# Patient Record
Sex: Female | Born: 1975 | Race: White | Hispanic: No | Marital: Married | Smoking: Never smoker
Health system: Southern US, Community
[De-identification: ages and names within clinical notes are randomized; demographics above are authoritative.]

## PROBLEM LIST (undated history)

## (undated) DIAGNOSIS — D649 Anemia, unspecified: Secondary | ICD-10-CM

## (undated) DIAGNOSIS — R519 Headache, unspecified: Secondary | ICD-10-CM

## (undated) DIAGNOSIS — J189 Pneumonia, unspecified organism: Secondary | ICD-10-CM

## (undated) DIAGNOSIS — I10 Essential (primary) hypertension: Secondary | ICD-10-CM

## (undated) DIAGNOSIS — N3281 Overactive bladder: Secondary | ICD-10-CM

## (undated) HISTORY — PX: CHOLECYSTECTOMY: SHX55

## (undated) HISTORY — PX: ADENOIDECTOMY: SUR15

---

## 2004-06-19 ENCOUNTER — Inpatient Hospital Stay: Payer: Self-pay | Admitting: Obstetrics & Gynecology

## 2009-01-09 ENCOUNTER — Inpatient Hospital Stay: Payer: Self-pay | Admitting: Unknown Physician Specialty

## 2011-09-08 ENCOUNTER — Ambulatory Visit: Payer: Self-pay

## 2011-09-10 ENCOUNTER — Ambulatory Visit: Payer: Self-pay

## 2015-11-29 DIAGNOSIS — D510 Vitamin B12 deficiency anemia due to intrinsic factor deficiency: Secondary | ICD-10-CM | POA: Insufficient documentation

## 2016-01-21 ENCOUNTER — Encounter: Payer: Self-pay | Admitting: *Deleted

## 2016-01-21 ENCOUNTER — Ambulatory Visit
Admission: EM | Admit: 2016-01-21 | Discharge: 2016-01-21 | Disposition: A | Discharge status: Home / Self Care | Payer: BC Managed Care – PPO | Attending: Internal Medicine | Admitting: Internal Medicine

## 2016-01-21 DIAGNOSIS — IMO0001 Reserved for inherently not codable concepts without codable children: Secondary | ICD-10-CM

## 2016-01-21 DIAGNOSIS — I10 Essential (primary) hypertension: Secondary | ICD-10-CM | POA: Diagnosis not present

## 2016-01-21 DIAGNOSIS — R030 Elevated blood-pressure reading, without diagnosis of hypertension: Secondary | ICD-10-CM

## 2016-01-21 NOTE — Discharge Instructions (Signed)
Your blood pressure today was elevated, without alarm signs.  This may reflect the effects of fatigue, stress, impending viral illness, medication/supplement side effect, dietary exposure (salt).  If it continues, would need to consider starting a blood pressure medicine and possibly further evaluation for specific causes of high blood pressure. Please make appointment with Dr Hyacinth MeekerMiller to recheck blood pressure and discuss next week. Go to ER for persistent/severe chest discomfort or headache or new shortness of breath.

## 2016-01-21 NOTE — ED Notes (Signed)
Pt states "haven't been feeling right today" so checked her BP at home and got 174/102 @ 1700, and again @ 1745 was 152/92.

## 2016-01-21 NOTE — ED Provider Notes (Signed)
CSN: [id number]     Arrival date & time 01/21/16  1849 History   None    Chief Complaint  Patient presents with  . Hypertension   HPI   39yo lady with elevated bp readings today at home (used mom's machine).  Some family members to ED recently with bp, heart problems.  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 Not on bp medication.  Has had elevated readings in past, not sure how high.  Little bit of chest pain (1 episode, 2-3 minutes, mild discomfort, at rest earlier today, no recurrence).  No current CP, no headache, not short of breath, no leg swelling. PCP is Dr Bethann PunchesMark Miller.  Recently ?WBC reading was elevated, on B12 supplement, will recheck WBC count on 6/13.  PMHx: recent elevation of ?WBC; elevated bp's in past Past Surgical History  Procedure Laterality Date  . Cholecystectomy     History reviewed. No pertinent family history. Social History  Substance Use Topics  . Smoking status: Never Smoker   . Smokeless tobacco: None  . Alcohol Use: Yes    Review of Systems  All other systems reviewed and are negative.   Allergies  Penicillins  Home Medications  B12 supplement (oral)   BP 161/90 mmHg  Pulse 86  Temp(Src) 98.1 F (36.7 C) (Oral)  Resp 16  Ht 5\' 1"  (1.549 m)  Wt 202 lb (91.627 kg)  BMI 38.19 kg/m2  SpO2 100%  LMP 12/22/2015 (Approximate) Physical Exam  Constitutional: She is oriented to person, place, and time. No distress.  Alert, nicely groomed Looks a little tired  HENT:  Head: Atraumatic.  Eyes:  Conjugate gaze, no eye redness/drainage  Neck: Neck supple.  Cardiovascular: Normal rate and regular rhythm.   Pulmonary/Chest: No respiratory distress. She has no wheezes. She has no rales.  Lungs clear, symmetric breath sounds  Abdominal: She exhibits no distension.  Musculoskeletal: Normal range of motion.  No leg swelling  Neurological: She is alert and oriented to person, place, and time.  Skin: Skin is warm and dry.   No cyanosis  Nursing note and vitals reviewed.   ED Course  Procedures (including critical care time)  ECG at UC: my reading no acute ST or T wave changes.   Normal Sinus Rhythm; minimal voltage criteria for LVH (?normal variant).   PR 122 msec; QRS 90 msec; QTc 479 msec; R axis -13.    MDM   1. Elevated blood pressure    Your blood pressure today was elevated, without alarm signs.   This may reflect the effects of fatigue, stress, impending viral illness, medication/supplement side effect, dietary exposure (salt).   If it continues, would need to consider starting a blood pressure medicine and possibly further evaluation for specific causes of high blood pressure.  Please make appointment with Dr Hyacinth MeekerMiller to recheck blood pressure and discuss next week.  Go to ER for persistent/severe chest discomfort or headache or new shortness of breath.    Eustace MooreLaura W Murray, MD 01/25/16 2132

## 2016-01-29 DIAGNOSIS — I10 Essential (primary) hypertension: Secondary | ICD-10-CM | POA: Insufficient documentation

## 2016-11-14 ENCOUNTER — Ambulatory Visit
Admission: EM | Admit: 2016-11-14 | Discharge: 2016-11-14 | Disposition: A | Discharge status: Home / Self Care | Payer: BC Managed Care – PPO | Attending: Family Medicine | Admitting: Family Medicine

## 2016-11-14 DIAGNOSIS — N390 Urinary tract infection, site not specified: Secondary | ICD-10-CM | POA: Diagnosis not present

## 2016-11-14 DIAGNOSIS — R109 Unspecified abdominal pain: Secondary | ICD-10-CM

## 2016-11-14 HISTORY — DX: Essential (primary) hypertension: I10

## 2016-11-14 LAB — PREGNANCY, URINE: PREG TEST UR: NEGATIVE

## 2016-11-14 LAB — URINALYSIS, COMPLETE (UACMP) WITH MICROSCOPIC
Bilirubin Urine: NEGATIVE
Glucose, UA: NEGATIVE mg/dL
NITRITE: NEGATIVE
PROTEIN: NEGATIVE mg/dL
Specific Gravity, Urine: 1.025 (ref 1.005–1.030)
pH: 5.5 (ref 5.0–8.0)

## 2016-11-14 MED ORDER — NITROFURANTOIN MONOHYD MACRO 100 MG PO CAPS: 100.0000 mg | ORAL_CAPSULE | Freq: Two times a day (BID) | ORAL | 0 refills | Status: DC

## 2016-11-14 NOTE — ED Triage Notes (Signed)
Pt with left flank pain starting Thursday, currently 7/10. Has had some nausea, no vomiting. Denies urinary sx.

## 2016-11-14 NOTE — ED Provider Notes (Signed)
CSN: [id number]     Arrival date & time 11/14/16  1342 History   First MD Initiated Contact with Patient 11/14/16 1403     Chief Complaint  Patient presents with  . Flank Pain   (Consider location/radiation/quality/duration/timing/severity/associated sxs/prior Treatment) Lt upper back/ flank area pain for 2 nights now. States the only thing she might have done was reaching over in the car to get shoes. Unknown of any injury. Denies any abd pain, no n/v/d. Last bm was today and normal. Denies any possibility of preg. Does have frequency but contributes that to bp meds. The only thing that has helped laying on a pillow on her side.       Past Medical History:  Diagnosis Date  . Hypertension    Past Surgical History:  Procedure Laterality Date  . ADENOIDECTOMY    . CHOLECYSTECTOMY     History reviewed. No pertinent family history. Social History  Substance Use Topics  . Smoking status: Never Smoker  . Smokeless tobacco: Never Used  . Alcohol use Yes     Comment: social   OB History    No data available     Review of Systems  Constitutional: Negative.   Respiratory: Negative.   Cardiovascular: Negative.   Genitourinary: Positive for frequency.  Musculoskeletal: Positive for back pain.       Lt upper back near flank area .     Allergies  Penicillins  Home Medications   Prior to Admission medications   Medication Sig Start Date End Date Taking? Authorizing Provider  losartan (COZAAR) 25 MG tablet Take 25 mg by mouth daily.   Yes Historical Provider, MD  nitrofurantoin, macrocrystal-monohydrate, (MACROBID) 100 MG capsule Take 1 capsule (100 mg total) by mouth 2 (two) times daily. 11/14/16   Tobi Bastos, NP   Meds Ordered and Administered this Visit  Medications - No data to display  BP 139/81 (BP Location: Left Arm)   Pulse 90   Temp 98 F (36.7 C) (Oral)   Resp 20   Ht 5\' 1"  (1.549 m)   Wt 232 lb (105.2 kg)   LMP 11/02/2016   SpO2 100%   BMI 43.84  kg/m  No data found.   Physical Exam  Constitutional: She appears well-developed.  Cardiovascular: Normal rate.   Pulmonary/Chest: Effort normal and breath sounds normal.  Abdominal: Soft. Bowel sounds are normal.  Musculoskeletal: She exhibits tenderness.  Lt upper flank, cv tenderness. No rash, no pustula's,   Neurological: She is alert.  Skin: Skin is warm.    Urgent Care Course     Procedures (including critical care time)  Labs Review Labs Reviewed  URINALYSIS, COMPLETE (UACMP) WITH MICROSCOPIC - Abnormal; Notable for the following:       Result Value   APPearance HAZY (*)    Hgb urine dipstick TRACE (*)    Ketones, ur TRACE (*)    Leukocytes, UA SMALL (*)    Squamous Epithelial / LPF 21-50 (*)    Bacteria, UA FEW (*)    All other components within normal limits  URINE CULTURE  PREGNANCY, URINE    Imaging Review No results found.           MDM   1. Lower urinary tract infectious disease   2. Left flank pain   You have an UTI and will need to take full abx then see pcp to re check your urine We sent a culture and will call if something test positive or a  need to change abx Drink clear fluids  Reviewed previous chart of uti in 08/2016  Discussed causes for uti     Tobi BastosMelanie A Syan Cullimore, NP 11/14/16 1440

## 2016-11-16 LAB — URINE CULTURE

## 2017-02-02 ENCOUNTER — Other Ambulatory Visit: Payer: Self-pay | Admitting: Obstetrics and Gynecology

## 2017-02-02 DIAGNOSIS — Z1231 Encounter for screening mammogram for malignant neoplasm of breast: Secondary | ICD-10-CM

## 2017-02-16 ENCOUNTER — Encounter: Payer: Self-pay | Admitting: Radiology

## 2017-02-16 ENCOUNTER — Ambulatory Visit
Admission: RE | Admit: 2017-02-16 | Discharge: 2017-02-16 | Disposition: A | Discharge status: Home / Self Care | Payer: BC Managed Care – PPO | Source: Ambulatory Visit | Attending: Obstetrics and Gynecology | Admitting: Obstetrics and Gynecology

## 2017-02-16 DIAGNOSIS — R928 Other abnormal and inconclusive findings on diagnostic imaging of breast: Secondary | ICD-10-CM | POA: Diagnosis not present

## 2017-02-16 DIAGNOSIS — Z1231 Encounter for screening mammogram for malignant neoplasm of breast: Secondary | ICD-10-CM | POA: Insufficient documentation

## 2017-02-19 ENCOUNTER — Other Ambulatory Visit: Payer: Self-pay | Admitting: Obstetrics and Gynecology

## 2017-02-19 DIAGNOSIS — N6489 Other specified disorders of breast: Secondary | ICD-10-CM

## 2017-02-19 DIAGNOSIS — R928 Other abnormal and inconclusive findings on diagnostic imaging of breast: Secondary | ICD-10-CM

## 2017-03-02 ENCOUNTER — Ambulatory Visit
Admission: RE | Admit: 2017-03-02 | Discharge: 2017-03-02 | Disposition: A | Discharge status: Home / Self Care | Payer: BC Managed Care – PPO | Source: Ambulatory Visit | Attending: Obstetrics and Gynecology | Admitting: Obstetrics and Gynecology

## 2017-03-02 DIAGNOSIS — N6489 Other specified disorders of breast: Secondary | ICD-10-CM | POA: Diagnosis not present

## 2017-03-02 DIAGNOSIS — R928 Other abnormal and inconclusive findings on diagnostic imaging of breast: Secondary | ICD-10-CM

## 2017-10-19 ENCOUNTER — Other Ambulatory Visit: Payer: Self-pay | Admitting: Internal Medicine

## 2017-10-19 ENCOUNTER — Ambulatory Visit
Admission: RE | Admit: 2017-10-19 | Discharge: 2017-10-19 | Disposition: A | Discharge status: Home / Self Care | Payer: BC Managed Care – PPO | Source: Ambulatory Visit | Attending: Internal Medicine | Admitting: Internal Medicine

## 2017-10-19 DIAGNOSIS — K760 Fatty (change of) liver, not elsewhere classified: Secondary | ICD-10-CM | POA: Diagnosis not present

## 2017-10-19 DIAGNOSIS — R109 Unspecified abdominal pain: Secondary | ICD-10-CM | POA: Insufficient documentation

## 2018-02-15 ENCOUNTER — Other Ambulatory Visit: Payer: Self-pay | Admitting: Obstetrics and Gynecology

## 2018-02-15 DIAGNOSIS — Z1231 Encounter for screening mammogram for malignant neoplasm of breast: Secondary | ICD-10-CM

## 2018-03-08 ENCOUNTER — Ambulatory Visit
Admission: RE | Admit: 2018-03-08 | Discharge: 2018-03-08 | Disposition: A | Discharge status: Home / Self Care | Payer: BC Managed Care – PPO | Source: Ambulatory Visit | Attending: Obstetrics and Gynecology | Admitting: Obstetrics and Gynecology

## 2018-03-08 DIAGNOSIS — Z1231 Encounter for screening mammogram for malignant neoplasm of breast: Secondary | ICD-10-CM | POA: Insufficient documentation

## 2018-09-26 ENCOUNTER — Encounter: Payer: Self-pay | Admitting: Urology

## 2018-09-26 ENCOUNTER — Ambulatory Visit: Payer: BC Managed Care – PPO | Admitting: Urology

## 2018-09-26 VITALS — BP 120/80 | HR 103 | Ht 61.0 in | Wt 228.0 lb

## 2018-09-26 DIAGNOSIS — N3281 Overactive bladder: Secondary | ICD-10-CM | POA: Diagnosis not present

## 2018-09-26 DIAGNOSIS — N921 Excessive and frequent menstruation with irregular cycle: Secondary | ICD-10-CM | POA: Insufficient documentation

## 2018-09-26 LAB — URINALYSIS, COMPLETE
Bilirubin, UA: NEGATIVE
GLUCOSE, UA: NEGATIVE
KETONES UA: NEGATIVE
Nitrite, UA: NEGATIVE
PROTEIN UA: NEGATIVE
SPEC GRAV UA: 1.02 (ref 1.005–1.030)
Urobilinogen, Ur: 0.2 mg/dL (ref 0.2–1.0)
pH, UA: 6.5 (ref 5.0–7.5)

## 2018-09-26 LAB — MICROSCOPIC EXAMINATION: RBC, UA: NONE SEEN /hpf (ref 0–2)

## 2018-09-26 NOTE — Progress Notes (Signed)
09/26/2018 3:36 PM   Ann Perry 1976/01/17 [id number]  Referring provider: Danella PentonMiller, Mark F, MD [phone] [facility] [place], [place] [id number]  CC: "Recurrent UTIs"  HPI: I saw Ann Perry in urology clinic in consultation for "recurrent UTIs" from Dr. Hyacinth MeekerMiller.  She is a healthy 43 year old female who reports a long history of urinary urgency, frequency, and pelvic cramping.  She does not have any prior documented positive urine cultures.  She feels her symptoms improve temporarily with antibiotics, but then recur within 1 to 2 weeks.  She does report occasional urge incontinence.  She works as a Runner, broadcasting/film/videoteacher and it can be difficult to get to the bathroom.  She drinks occasional coffee and multiple sodas per day.  She has nocturia 1-2 times per night.  She denies gross hematuria.  She is a non-smoker.  There are no aggravating factors.  Severity is mild to moderate.  She also had a CT scan in February 2019 for left-sided flank pain, however no hydronephrosis or urolithiasis was seen.   PMH: Past Medical History:  Diagnosis Date  . Hypertension     Surgical History: Past Surgical History:  Procedure Laterality Date  . ADENOIDECTOMY    . CHOLECYSTECTOMY      Allergies:  Allergies  Allergen Reactions  . Penicillins Hives    Family History: No family history on file.  Social History:  reports that she has never smoked. She has never used smokeless tobacco. She reports current alcohol use. She reports that she does not use drugs.  ROS: Please see flowsheet from today's date for complete review of systems.  Physical Exam: BP 120/80   Pulse (!) 103   Ht 5\' 1"  (1.549 m)   Wt 228 lb (103.4 kg)   BMI 43.08 kg/m    Constitutional:  Alert and oriented, No acute distress. Cardiovascular: No clubbing, cyanosis, or edema. Respiratory: Normal respiratory effort, no increased work of breathing. GI: Abdomen is soft, nontender, nondistended, no  abdominal masses GU: No CVA tenderness Lymph: No cervical or inguinal lymphadenopathy. Skin: No rashes, bruises or suspicious lesions. Neurologic: Grossly intact, no focal deficits, moving all 4 extremities. Psychiatric: Normal mood and affect.  Laboratory Data: Urinalysis today 0-5 WBCs, 0 RBCs, few bacteria, no yeast, nitrite negative  Pertinent Imaging: I have personally reviewed the CT abdomen pelvis from February 2019, no urolithiasis or hydronephrosis.  Assessment & Plan:   In summary, the patient is a healthy 43 year old female with reported diagnosis of "recurrent UTIs", however she has no prior positive cultures documented.  Her symptoms are more consistent with overactive bladder.  We discussed that overactive bladder (OAB) is not a disease, but is a symptom complex that is generally not life-threatening.  Symptoms typically include urinary urgency, frequency, and urge incontinence.  There are numerous treatment options, however there are risks and benefits with both medical and surgical management.  First-line treatment is behavioral therapies including bladder training, pelvic floor muscle training, and fluid management.  Second line treatments include oral antimuscarinics(Ditropan er, Trospium) and beta-3 agonist (Mybetriq). There is typically a period of medication trial (4-8 weeks) to find the optimal therapy and dosing. If symptoms are bothersome despite the above management, third line options include intra-detrusor botox, peripheral tibial nerve stimulation (PTNS), and interstim (SNS). These are more invasive treatments with higher side effect profile, but may improve quality of life for patients with severe OAB symptoms.   Trial of anticholinergic (Vesicare samples given) RTC 4 weeks for  symptom check, consider Myrbetriq if refractory symptoms  Sondra Come, MD  [facility] [address], [address] [place], [place] [address] [phone]

## 2018-09-26 NOTE — Patient Instructions (Signed)

## 2018-10-25 ENCOUNTER — Ambulatory Visit: Payer: BC Managed Care – PPO | Admitting: Urology

## 2018-10-25 ENCOUNTER — Encounter: Payer: Self-pay | Admitting: Urology

## 2018-10-25 VITALS — BP 125/77 | HR 83 | Ht 61.0 in | Wt 228.0 lb

## 2018-10-25 DIAGNOSIS — N3281 Overactive bladder: Secondary | ICD-10-CM

## 2018-10-25 LAB — BLADDER SCAN AMB NON-IMAGING

## 2018-10-25 MED ORDER — SOLIFENACIN SUCCINATE 5 MG PO TABS: 5.0000 mg | ORAL_TABLET | Freq: Every day | ORAL | 3 refills | Status: DC

## 2018-10-25 NOTE — Progress Notes (Signed)
   10/25/2018 4:14 PM   Ann Perry [birth year] [id number]  Reason for visit: Follow up OAB  HPI: I saw [name] back in urology clinic for overactive bladder.  She is a healthy 43 year old female that was originally referred with urgency, frequency, and urge incontinence.  We started her Vesicare at our last visit, and she has noticed a significant improvement in her symptoms.  She specifically notes improvement in her urgency and frequency.  She is very satisfied with her quality of life regarding her urinary symptoms currently.  Solifenacin 5 mg daily, prescription provided with good [email or web address] coupon RTC 1 year for symptom check  A total of 10 minutes were spent face-to-face with the patient, greater than 50% was spent in patient education, counseling, and coordination of care regarding overactive bladder.    Sondra Come, MD  [facility] [address], [address] [place], [place] [address] [phone]

## 2018-10-25 NOTE — Patient Instructions (Signed)

## 2019-02-23 ENCOUNTER — Other Ambulatory Visit: Payer: Self-pay | Admitting: Internal Medicine

## 2019-02-23 DIAGNOSIS — Z1231 Encounter for screening mammogram for malignant neoplasm of breast: Secondary | ICD-10-CM

## 2019-04-04 ENCOUNTER — Ambulatory Visit
Admission: RE | Admit: 2019-04-04 | Discharge: 2019-04-04 | Disposition: A | Discharge status: Home / Self Care | Payer: BC Managed Care – PPO | Source: Ambulatory Visit | Attending: Internal Medicine | Admitting: Internal Medicine

## 2019-04-04 DIAGNOSIS — Z1231 Encounter for screening mammogram for malignant neoplasm of breast: Secondary | ICD-10-CM | POA: Insufficient documentation

## 2019-04-27 ENCOUNTER — Other Ambulatory Visit: Payer: Self-pay

## 2019-04-27 ENCOUNTER — Ambulatory Visit (INDEPENDENT_AMBULATORY_CARE_PROVIDER_SITE_OTHER): Payer: BC Managed Care – PPO | Admitting: Physician Assistant

## 2019-04-27 ENCOUNTER — Encounter: Payer: Self-pay | Admitting: Physician Assistant

## 2019-04-27 VITALS — BP 123/79 | HR 111 | Ht 61.0 in | Wt 232.0 lb

## 2019-04-27 DIAGNOSIS — R3989 Other symptoms and signs involving the genitourinary system: Secondary | ICD-10-CM

## 2019-04-27 DIAGNOSIS — R350 Frequency of micturition: Secondary | ICD-10-CM

## 2019-04-27 LAB — BLADDER SCAN AMB NON-IMAGING: Scan Result: 7

## 2019-04-27 NOTE — Progress Notes (Signed)
04/27/2019 9:46 AM   Ann Perry June 20, 1976 [id number]  CC: Bladder pressure, frequency, sensation of incomplete emptying  HPI: Ann Perry is a 43 y.o. female who presents today for evaluation of possible UTI. She is an established [facility] patient who last saw Dr. Richardo HanksSninsky on 10/25/2018 for follow-up of OAB with primary symptoms of urgency, frequency, and pelvic cramping.  She was noted to be doing well on a trial of Vesicare at that time.  She reports a 2-week history of bladder pressure, frequency, and the sensation of incomplete bladder emptying. She denies dysuria and gross hematuria. She has taken Azo at home with inadequate symptom palliation.  She does report some constipation at baseline.  She does not have a history of recurrent UTI.  Though she was previously diagnosed with "recurrent UTI", there are no positive urine cultures in her medical record.  It is thought that this was a misdiagnosis of her overactive bladder symptoms.  In-office UA today positive for 2+ leukocyte esterase and trace ketones; urine microscopy with 6-10 WBCs/HPF.  PVR 7 mL.  PMH: Past Medical History:  Diagnosis Date  . Hypertension     Surgical History: Past Surgical History:  Procedure Laterality Date  . ADENOIDECTOMY    . CHOLECYSTECTOMY      Home Medications:  Allergies as of 04/27/2019      Reactions   Penicillins Hives      Medication List       Accurate as of April 27, 2019 11:59 PM. If you have any questions, ask your nurse or doctor.        losartan-hydrochlorothiazide 50-12.5 MG tablet Commonly known as: HYZAAR TAKE 1 TABLET BY MOUTH ONCE DAILY FOR BLOOD PRESSURE   solifenacin 5 MG tablet Commonly known as: VESICARE Take 1 tablet (5 mg total) by mouth daily.       Allergies:  Allergies  Allergen Reactions  . Penicillins Hives    Family History: History reviewed. No pertinent family history.  Social History:   reports that she has never smoked. She  has never used smokeless tobacco. She reports current alcohol use. She reports that she does not use drugs.  ROS: UROLOGY Frequent Urination?: Yes Hard to postpone urination?: No Burning/pain with urination?: No Get up at night to urinate?: Yes Leakage of urine?: No Urine stream starts and stops?: No Trouble starting stream?: No Do you have to strain to urinate?: No Blood in urine?: No Urinary tract infection?: No Sexually transmitted disease?: No Injury to kidneys or bladder?: No Painful intercourse?: No Weak stream?: No Currently pregnant?: No Vaginal bleeding?: No Last menstrual period?: n  Gastrointestinal Nausea?: No Vomiting?: No Indigestion/heartburn?: No Diarrhea?: No Constipation?: No  Constitutional Fever: No Night sweats?: No Weight loss?: No Fatigue?: No  Skin Skin rash/lesions?: No Itching?: No  Eyes Blurred vision?: No Double vision?: No  Ears/Nose/Throat Sore throat?: No Sinus problems?: No  Hematologic/Lymphatic Swollen glands?: No Easy bruising?: No  Cardiovascular Leg swelling?: No Chest pain?: No  Respiratory Cough?: No Shortness of breath?: No  Endocrine Excessive thirst?: No  Musculoskeletal Back pain?: No Joint pain?: No  Neurological Headaches?: No Dizziness?: No  Psychologic Depression?: No Anxiety?: No  Physical Exam: BP 123/79 (BP Location: Left Arm, Patient Position: Sitting, Cuff Size: Normal)   Pulse (!) 111   Ht 5\' 1"  (1.549 m)   Wt 232 lb (105.2 kg)   BMI 43.84 kg/m   Constitutional:  Alert and oriented, no acute distress, nontoxic appearing HEENT: [name], AT  Cardiovascular: No clubbing, cyanosis, or edema Respiratory: Normal respiratory effort, no increased work of breathing GI: Abdomen is soft, nontender, nondistended, no abdominal masses Neurologic: Grossly intact, no focal deficits, moving all 4 extremities Psychiatric: Normal mood and affect  Laboratory Data: Results for orders placed or  performed in visit on 04/27/19  Microscopic Examination   URINE  Result Value Ref Range   WBC, UA 6-10 (A) 0 - 5 /hpf   RBC None seen 0 - 2 /hpf   Epithelial Cells (non renal) 0-10 0 - 10 /hpf   Bacteria, UA Few None seen/Few  Urinalysis, Complete  Result Value Ref Range   Specific Gravity, UA 1.020 1.005 - 1.030   pH, UA 7.0 5.0 - 7.5   Color, UA Yellow Yellow   Appearance Ur Cloudy (A) Clear   Leukocytes,UA 2+ (A) Negative   Protein,UA Negative Negative/Trace   Glucose, UA Negative Negative   Ketones, UA Trace (A) Negative   RBC, UA Negative Negative   Bilirubin, UA Negative Negative   Urobilinogen, Ur 0.2 0.2 - 1.0 mg/dL   Nitrite, UA Negative Negative   Microscopic Examination See below:    Microscopic Examination WILL FOLLOW   BLADDER SCAN AMB NON-IMAGING  Result Value Ref Range   Scan Result 7    Assessment & Plan:   1. Urinary frequency Symptoms present at baseline, however with possible exacerbation over the past 2 weeks.  UA suggestive of slight inflammation, however without overt infective markers.  Culture pending.  Will defer treatment for possible UTI pending culture results. -Urinalysis, complete -Urine culture  2. Sensation of pressure in bladder area Patient reports this is a new symptom for the past 2 weeks.  PVR within normal limits, not concerned for urinary retention at this time.  She also reports some constipation.  I counseled her on the role of constipation in pelvic pressure and irritative voiding symptoms.  I advised her to start a bowel regimen to treat her constipation.  She expressed an understanding of this plan. -PVR -Start bowel regimen of Benefiber or [place], PA-C  [place] [address] [place], [place] [address] [phone]

## 2019-04-30 LAB — CULTURE, URINE COMPREHENSIVE

## 2019-05-04 LAB — MICROSCOPIC EXAMINATION: RBC: NONE SEEN /hpf (ref 0–2)

## 2019-05-04 LAB — URINALYSIS, COMPLETE
Bilirubin, UA: NEGATIVE
Glucose, UA: NEGATIVE
Nitrite, UA: NEGATIVE
Protein,UA: NEGATIVE
RBC, UA: NEGATIVE
Specific Gravity, UA: 1.02 (ref 1.005–1.030)
Urobilinogen, Ur: 0.2 mg/dL (ref 0.2–1.0)
pH, UA: 7 (ref 5.0–7.5)

## 2019-10-21 ENCOUNTER — Ambulatory Visit: Payer: BC Managed Care – PPO

## 2019-10-26 ENCOUNTER — Encounter: Payer: Self-pay | Admitting: Physician Assistant

## 2019-10-26 ENCOUNTER — Other Ambulatory Visit: Payer: Self-pay

## 2019-10-26 ENCOUNTER — Ambulatory Visit: Payer: BC Managed Care – PPO | Admitting: Physician Assistant

## 2019-10-26 VITALS — BP 128/74 | Ht 61.0 in | Wt 233.0 lb

## 2019-10-26 DIAGNOSIS — R109 Unspecified abdominal pain: Secondary | ICD-10-CM | POA: Diagnosis not present

## 2019-10-26 DIAGNOSIS — R350 Frequency of micturition: Secondary | ICD-10-CM

## 2019-10-26 DIAGNOSIS — R10A2 Flank pain, left side: Secondary | ICD-10-CM

## 2019-10-26 LAB — BLADDER SCAN AMB NON-IMAGING: Scan Result: 23

## 2019-10-26 MED ORDER — SOLIFENACIN SUCCINATE 5 MG PO TABS: 5.0000 mg | ORAL_TABLET | Freq: Every day | ORAL | 3 refills | Status: DC

## 2019-10-26 NOTE — Progress Notes (Signed)
10/26/2019 3:59 PM   Ann Perry [id number]  CC: OAB follow-up  HPI: Ann Perry is a 44 y.o. female who presents today for OAB follow-up on Vesicare.  She reports continued symptomatic improvement on Vesicare.  She reports continued, mild urinary urgency and frequency but states this is tolerable.  She states her urge incontinence has resolved since starting Vesicare.  She does continue to have mild SUI and wears pads daily for security. PVR 52mL.  Additionally, she reports an approximate 1 month history of intermittent left flank discomfort.  She describes these twinges of pain as pulsatile in nature and rates them about 3/10 in severity.  She is unsure if these are MSK or renal in origin and reports poor fluid intake.  She does not have a personal history of nephrolithiasis.  She does have a family history of nephrolithiasis in her father.  She underwent CT stone study on 10/19/2017 with no stones visualized.  She denies fever, chills, nausea, vomiting, and gross hematuria associated with this.  PMH: Past Medical History:  Diagnosis Date  . Hypertension     Surgical History: Past Surgical History:  Procedure Laterality Date  . ADENOIDECTOMY    . CHOLECYSTECTOMY      Home Medications:  Allergies as of 10/26/2019      Reactions   Penicillins Hives      Medication List       Accurate as of October 26, 2019  3:59 PM. If you have any questions, ask your nurse or doctor.        losartan-hydrochlorothiazide 50-12.5 MG tablet Commonly known as: HYZAAR TAKE 1 TABLET BY MOUTH ONCE DAILY FOR BLOOD PRESSURE   solifenacin 5 MG tablet Commonly known as: VESICARE Take 1 tablet (5 mg total) by mouth daily.       Allergies:  Allergies  Allergen Reactions  . Penicillins Hives    Family History: No family history on file.  Social History:   reports that she has never smoked. She has never used smokeless tobacco. She reports current alcohol use. She  reports that she does not use drugs.  Physical Exam: BP 128/74   Ht 5\' 1"  (1.549 m)   Wt 233 lb (105.7 kg)   BMI 44.02 kg/m   Constitutional:  Alert and oriented, no acute distress, nontoxic appearing HEENT: [place], AT Cardiovascular: No clubbing, cyanosis, or edema Respiratory: Normal respiratory effort, no increased work of breathing GU: No CVA tenderness Skin: No rashes, bruises or suspicious lesions Neurologic: Grossly intact, no focal deficits, moving all 4 extremities Psychiatric: Normal mood and affect  Laboratory Data: Results for orders placed or performed in visit on 10/26/19  Bladder Scan (Post Void Residual) in office  Result Value Ref Range   Scan Result 23 ml    Assessment & Plan:   1. Urinary frequency Significantly improved on Vesicare.  Patient tolerating well.  PVR WNL.  Will refill for another year. - Bladder Scan (Post Void Residual) in office - solifenacin (VESICARE) 5 MG tablet; Take 1 tablet (5 mg total) by mouth daily.  Dispense: 90 tablet; Refill: 3  2. Left flank pain Likely MSK in origin, however given reports of poor p.o. fluid intake and family history of nephrolithiasis, cannot rule out nephrolithiasis.  Will proceed with renal ultrasound at this time for further evaluation.  Order placed. - 12/26/19 RENAL   Return in about 1 year (around 10/25/2020) for OAB f/u with PVR with Dr. 12/25/2020.  Richardo Hanks, PA-C  [facility] [address], [place], [place] [address] [phone]

## 2019-11-29 ENCOUNTER — Other Ambulatory Visit: Payer: Self-pay

## 2019-11-29 ENCOUNTER — Ambulatory Visit
Admission: RE | Admit: 2019-11-29 | Discharge: 2019-11-29 | Disposition: A | Discharge status: Home / Self Care | Payer: BC Managed Care – PPO | Source: Ambulatory Visit | Attending: Physician Assistant | Admitting: Physician Assistant

## 2019-11-29 DIAGNOSIS — R109 Unspecified abdominal pain: Secondary | ICD-10-CM | POA: Insufficient documentation

## 2019-12-04 ENCOUNTER — Telehealth: Payer: Self-pay

## 2019-12-04 NOTE — Telephone Encounter (Signed)
Ann Perry called and left a message inquiring about her RUS results. Please advise.

## 2019-12-05 NOTE — Telephone Encounter (Signed)
Patient called back about her results

## 2019-12-05 NOTE — Telephone Encounter (Signed)
Please contact the patient and inform her that her renal ultrasound was normal.  I do not see any swelling in her kidneys or kidney stones that would explain her pain.  Based on this, I suspect it is musculoskeletal in nature.

## 2019-12-05 NOTE — Telephone Encounter (Signed)
LMOM to return my call.

## 2019-12-06 NOTE — Telephone Encounter (Signed)
Notified patient as advised, patient verbalized understanding.  

## 2019-12-07 ENCOUNTER — Other Ambulatory Visit: Payer: Self-pay | Admitting: Urology

## 2019-12-07 DIAGNOSIS — R350 Frequency of micturition: Secondary | ICD-10-CM

## 2019-12-11 ENCOUNTER — Other Ambulatory Visit: Payer: Self-pay | Admitting: Urology

## 2019-12-11 DIAGNOSIS — R350 Frequency of micturition: Secondary | ICD-10-CM

## 2019-12-11 NOTE — Telephone Encounter (Signed)
Incoming call from pt who states that she needs her RX for Vesicare sent to the [facility] on [address] not [facility] as she is using [facility] to get the medication and [facility] is the lowest cost. RX sent.

## 2020-03-15 ENCOUNTER — Other Ambulatory Visit: Payer: Self-pay | Admitting: Internal Medicine

## 2020-03-15 ENCOUNTER — Other Ambulatory Visit: Payer: Self-pay | Admitting: Obstetrics and Gynecology

## 2020-03-15 DIAGNOSIS — Z1231 Encounter for screening mammogram for malignant neoplasm of breast: Secondary | ICD-10-CM

## 2020-04-05 ENCOUNTER — Ambulatory Visit
Admission: RE | Admit: 2020-04-05 | Discharge: 2020-04-05 | Disposition: A | Discharge status: Home / Self Care | Payer: BC Managed Care – PPO | Source: Ambulatory Visit | Attending: Internal Medicine | Admitting: Internal Medicine

## 2020-04-05 ENCOUNTER — Other Ambulatory Visit: Payer: Self-pay

## 2020-04-05 DIAGNOSIS — Z1231 Encounter for screening mammogram for malignant neoplasm of breast: Secondary | ICD-10-CM | POA: Diagnosis not present

## 2020-07-26 ENCOUNTER — Ambulatory Visit (INDEPENDENT_AMBULATORY_CARE_PROVIDER_SITE_OTHER): Payer: BC Managed Care – PPO

## 2020-07-26 DIAGNOSIS — Z20822 Contact with and (suspected) exposure to covid-19: Secondary | ICD-10-CM | POA: Diagnosis not present

## 2020-07-26 LAB — POC COVID19 BINAXNOW: SARS Coronavirus 2 Ag: NEGATIVE

## 2020-10-01 ENCOUNTER — Telehealth: Payer: Self-pay | Admitting: Urology

## 2020-10-01 DIAGNOSIS — R350 Frequency of micturition: Secondary | ICD-10-CM

## 2020-10-01 MED ORDER — SOLIFENACIN SUCCINATE 5 MG PO TABS: 5.0000 mg | ORAL_TABLET | Freq: Every day | ORAL | 0 refills | Status: DC

## 2020-10-01 NOTE — Telephone Encounter (Signed)
Pt. Will be out of this medication before her yearly follow up appointment on 10/31/20. She request a refill be sent in as soon as possible.

## 2020-10-01 NOTE — Telephone Encounter (Signed)
Short term rx sent to last pt until appt.

## 2020-10-31 ENCOUNTER — Other Ambulatory Visit: Payer: Self-pay

## 2020-10-31 ENCOUNTER — Encounter: Payer: Self-pay | Admitting: Urology

## 2020-10-31 ENCOUNTER — Ambulatory Visit (INDEPENDENT_AMBULATORY_CARE_PROVIDER_SITE_OTHER): Payer: BC Managed Care – PPO | Admitting: Urology

## 2020-10-31 VITALS — BP 137/83 | HR 89 | Wt 231.5 lb

## 2020-10-31 DIAGNOSIS — R350 Frequency of micturition: Secondary | ICD-10-CM

## 2020-10-31 DIAGNOSIS — R3989 Other symptoms and signs involving the genitourinary system: Secondary | ICD-10-CM

## 2020-10-31 DIAGNOSIS — N3281 Overactive bladder: Secondary | ICD-10-CM

## 2020-10-31 LAB — BLADDER SCAN AMB NON-IMAGING

## 2020-10-31 MED ORDER — SOLIFENACIN SUCCINATE 5 MG PO TABS: 5.0000 mg | ORAL_TABLET | Freq: Every day | ORAL | 1 refills | Status: DC

## 2020-10-31 NOTE — Patient Instructions (Signed)
Overactive Bladder, Adult  Overactive bladder is a condition in which a person has a sudden and frequent need to urinate. A person might also leak urine if he or she cannot get to the bathroom fast enough (urinary incontinence). Sometimes, symptoms can interfere with work or social activities. What are the causes? Overactive bladder is associated with poor nerve signals between your bladder and your brain. Your bladder may get the signal to empty before it is full. You may also have very sensitive muscles that make your bladder squeeze too soon. This condition may also be caused by other factors, such as:  Medical conditions: ? Urinary tract infection. ? Infection of nearby tissues. ? Prostate enlargement. ? Bladder stones, inflammation, or tumors. ? Diabetes. ? Muscle or nerve weakness, especially from these conditions:  A spinal cord injury.  Stroke.  Multiple sclerosis.  Parkinson's disease.  Other causes: ? Surgery on the uterus or urethra. ? Drinking too much caffeine or alcohol. ? Certain medicines, especially those that eliminate extra fluid in the body (diuretics). ? Constipation. What increases the risk? You may be at greater risk for overactive bladder if you:  Are an older adult.  Smoke.  Are going through menopause.  Have prostate problems.  Have a neurological disease, such as stroke, dementia, Parkinson's disease, or multiple sclerosis (MS).  Eat or drink alcohol, spicy food, caffeine, and other things that irritate the bladder.  Are overweight or obese. What are the signs or symptoms? Symptoms of this condition include a sudden, strong urge to urinate. Other symptoms include:  Leaking urine.  Urinating 8 or more times a day.  Waking up to urinate 2 or more times overnight. How is this diagnosed? This condition may be diagnosed based on:  Your symptoms and medical history.  A physical exam.  Blood or urine tests to check for possible causes,  such as infection. You may also need to see a health care provider who specializes in urinary tract problems. This is called a urologist. How is this treated? Treatment for overactive bladder depends on the cause of your condition and whether it is mild or severe. Treatment may include:  Bladder training, such as: ? Learning to control the urge to urinate by following a schedule to urinate at regular intervals. ? Doing Kegel exercises to strengthen the pelvic floor muscles that support your bladder.  Special devices, such as: ? Biofeedback. This uses sensors to help you become aware of your body's signals. ? Electrical stimulation. This uses electrodes placed inside the body (implanted) or outside the body. These electrodes send gentle pulses of electricity to strengthen the nerves or muscles that control the bladder. ? Women may use a plastic device, called a pessary, that fits into the vagina and supports the bladder.  Medicines, such as: ? Antibiotics to treat bladder infection. ? Antispasmodics to stop the bladder from releasing urine at the wrong time. ? Tricyclic antidepressants to relax bladder muscles. ? Injections of botulinum toxin type A directly into the bladder tissue to relax bladder muscles.  Surgery, such as: ? A device may be implanted to help manage the nerve signals that control urination. ? An electrode may be implanted to stimulate electrical signals in the bladder. ? A procedure may be done to change the shape of the bladder. This is done only in very severe cases. Follow these instructions at home: Eating and drinking  Make diet or lifestyle changes recommended by your health care provider. These may include: ? Drinking fluids   throughout the day and not only with meals. ? Cutting down on caffeine or alcohol. ? Eating a healthy and balanced diet to prevent constipation. This may include:  Choosing foods that are high in fiber, such as beans, whole grains, and  fresh fruits and vegetables.  Limiting foods that are high in fat and processed sugars, such as fried and sweet foods.   Lifestyle  Lose weight if needed.  Do not use any products that contain nicotine or tobacco. These include cigarettes, chewing tobacco, and vaping devices, such as e-cigarettes. If you need help quitting, ask your health care provider.   General instructions  Take over-the-counter and prescription medicines only as told by your health care provider.  If you were prescribed an antibiotic medicine, take it as told by your health care provider. Do not stop taking the antibiotic even if you start to feel better.  Use any implants or pessary as told by your health care provider.  If needed, wear pads to absorb urine leakage.  Keep a log to track how much and when you drink, and when you need to urinate. This will help your health care provider monitor your condition.  Keep all follow-up visits. This is important. Contact a health care provider if:  You have a fever or chills.  Your symptoms do not get better with treatment.  Your pain and discomfort get worse.  You have more frequent urges to urinate. Get help right away if:  You are not able to control your bladder. Summary  Overactive bladder refers to a condition in which a person has a sudden and frequent need to urinate.  Several conditions may lead to an overactive bladder.  Treatment for overactive bladder depends on the cause and severity of your condition.  Making lifestyle changes, doing Kegel exercises, keeping a log, and taking medicines can help with this condition. This information is not intended to replace advice given to you by your health care provider. Make sure you discuss any questions you have with your health care provider. Document Revised: 04/29/2020 Document Reviewed: 04/29/2020 Elsevier Patient Education  2021 Elsevier Inc.  

## 2020-10-31 NOTE — Progress Notes (Signed)
   10/31/2020 4:26 PM   Saleema Silvestre Gunner 29-Dec-1975 [id number]  Reason for visit: Follow up OAB  HPI: I saw Ms. Gosse back in urology clinic for follow-up of overactive bladder.  She is a healthy 45 year old female who we started on Vesicare in March 2020 for urinary symptoms of urgency, frequency, and urge incontinence.  She has had excellent results on this medication and is no longer having any incontinence.  She still has some mild to moderate urgency but this is significantly improved.  She has minimal to no stress incontinence.  She had some flank pain last year that prompted a renal ultrasound ordered by our PA, but this was benign.  She would like to continue with Vesicare 5 mg daily.  Behavioral strategies discussed including bladder irritants.  RTC 1 year with PA Continue Vesicare   Sondra Come, MD  [facility] [address] [place], [place] [address] [phone]

## 2021-01-16 IMAGING — MG DIGITAL SCREENING BILATERAL MAMMOGRAM WITH TOMO AND CAD
6 of 10 series · 6 of 30 positions shown · non-contrast
Comparison: Previous exam(s).

CLINICAL DATA: Screening.

EXAM:
DIGITAL SCREENING BILATERAL MAMMOGRAM WITH TOMO AND CAD

[R MLO synth-2D (1 of 2)]
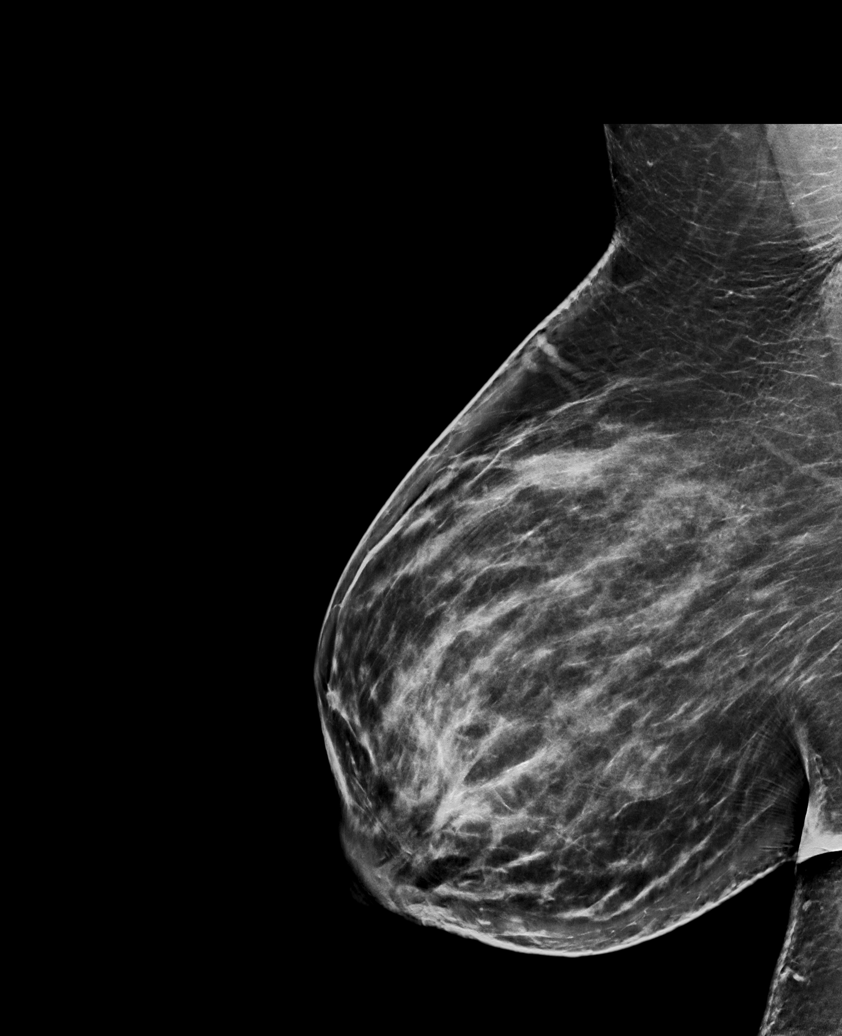

[L CC synth-2D]
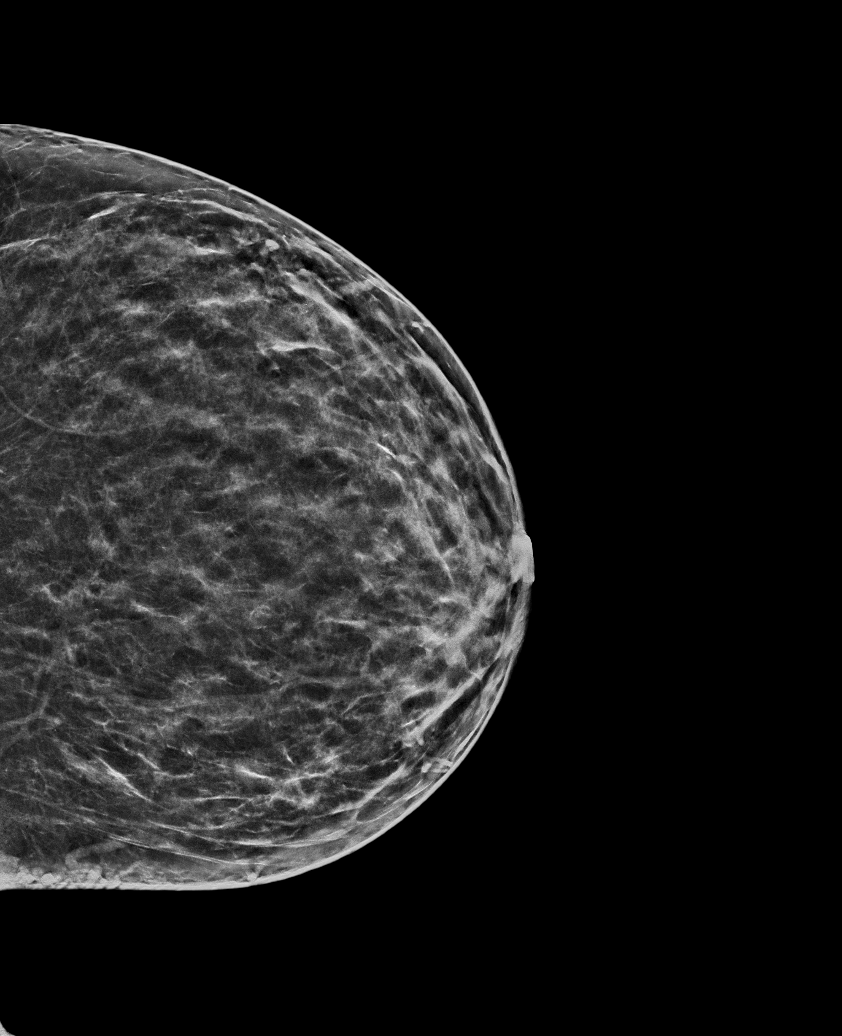

[L MLO synth-2D]
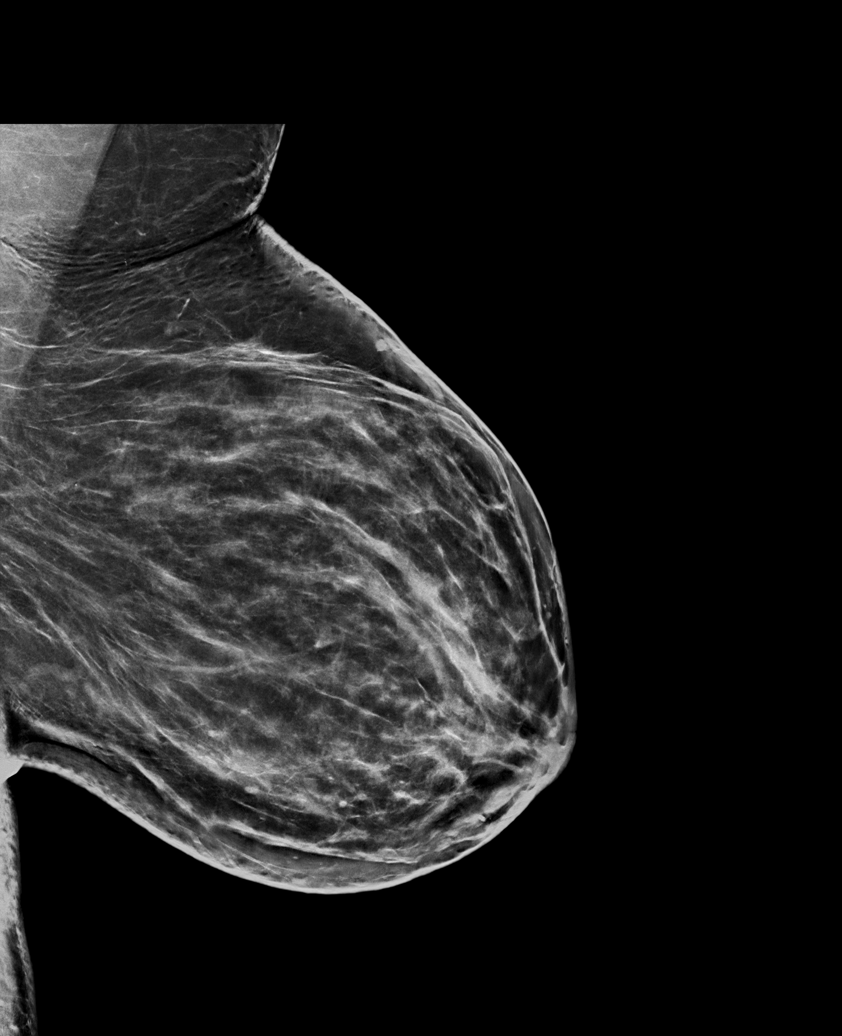

[R CC synth-2D]
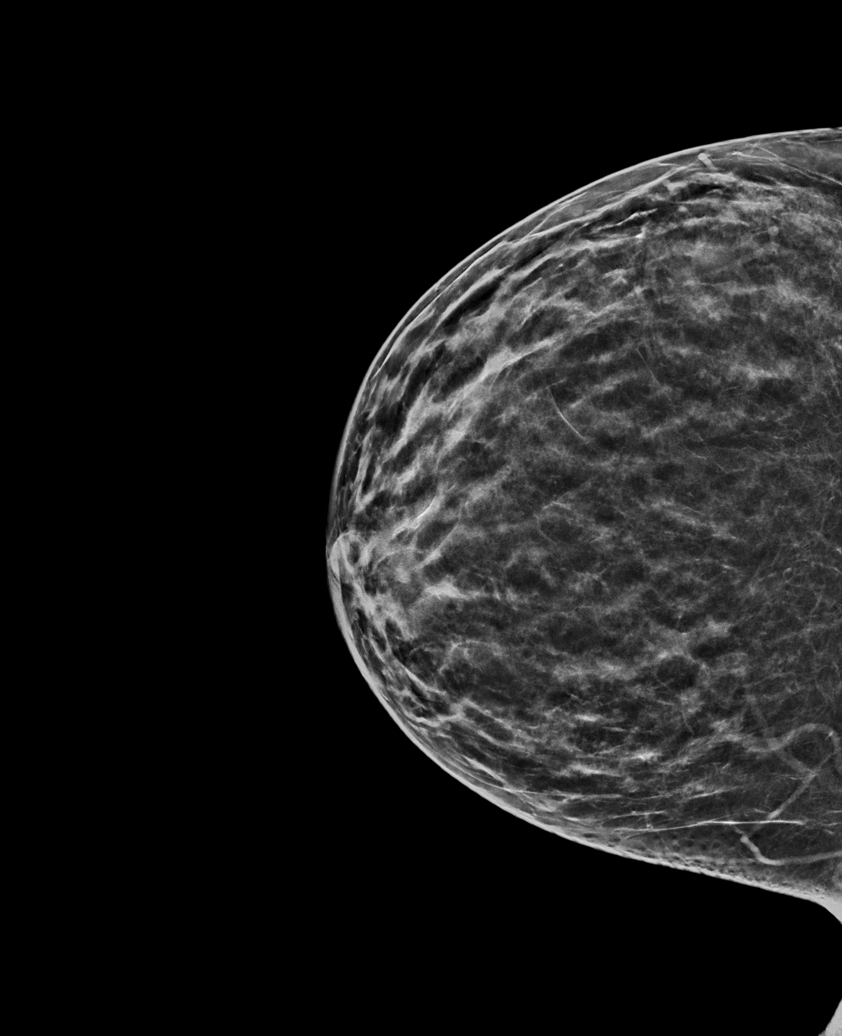

[R MLO synth-2D (2 of 2)]
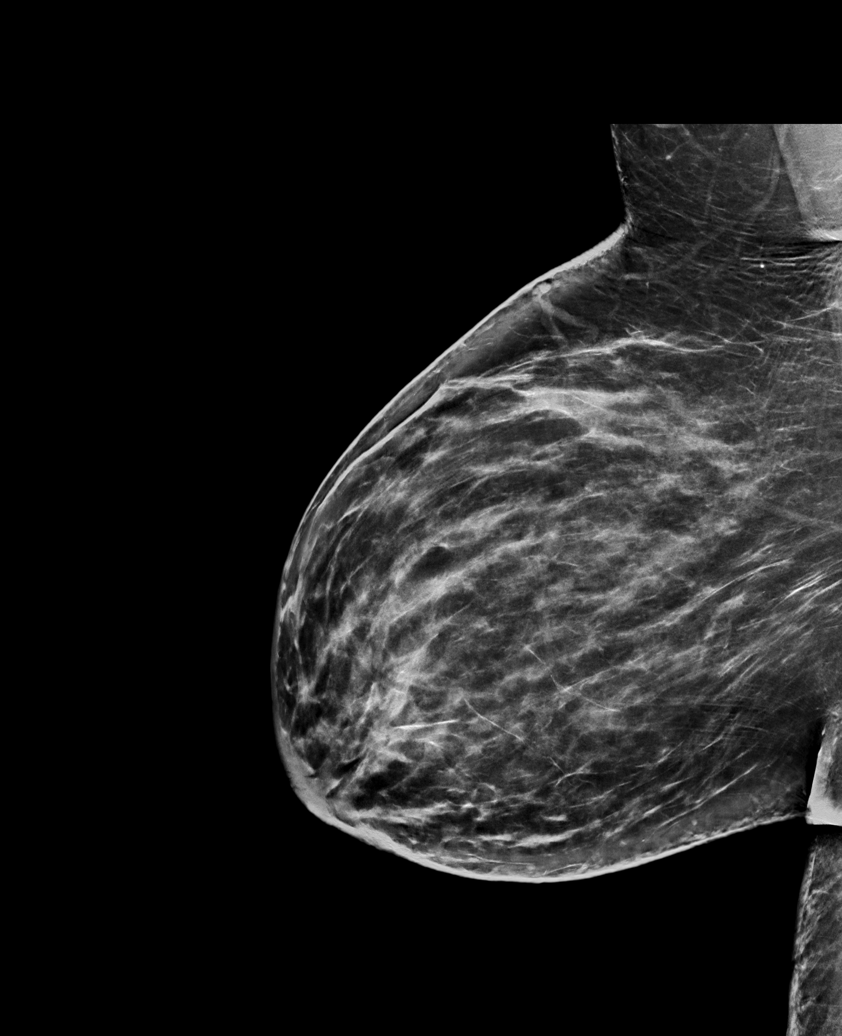

[L MLO tomo · tomo slice 44/87.0]
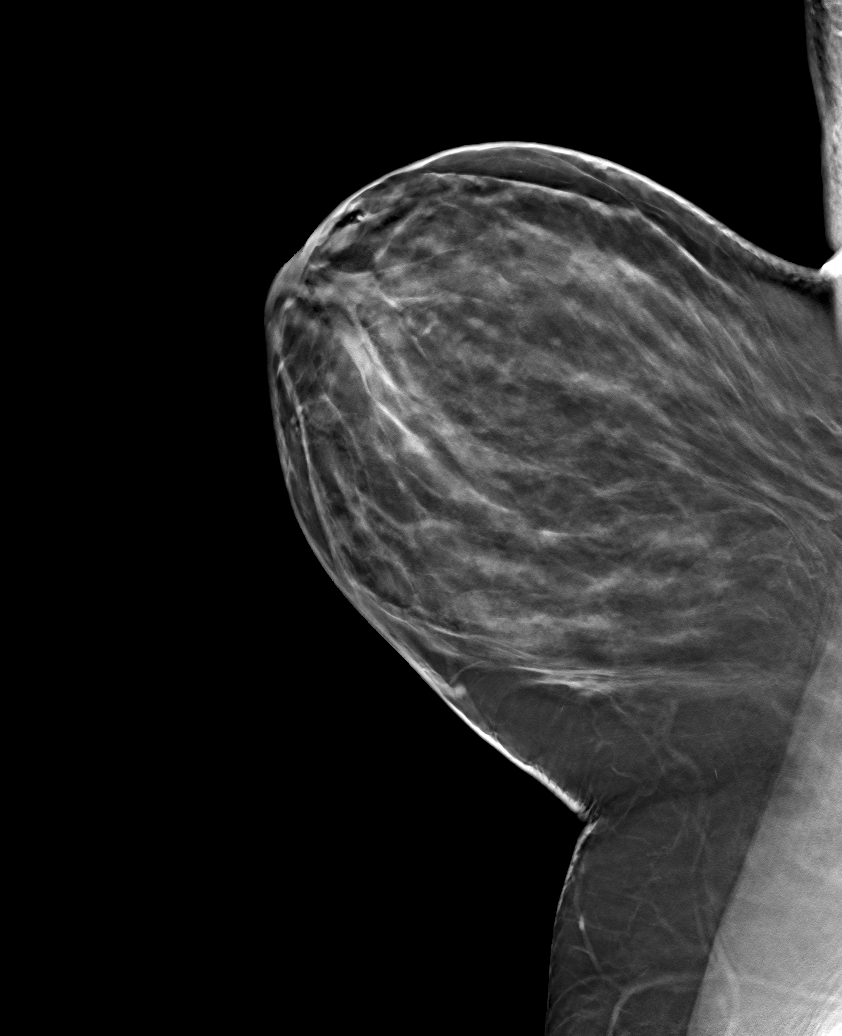

[6 of 30 positions shown; findings below may reference images not displayed]

ACR Breast Density Category c: The breast tissue is heterogeneously
dense, which may obscure small masses.
FINDINGS: There are no findings suspicious for malignancy. Images were
processed with CAD.
IMPRESSION: No mammographic evidence of malignancy. A result letter of this
screening mammogram will be mailed directly to the patient.

RECOMMENDATION:
Screening mammogram in one year. (Code:[id number]-[id number])

BI-RADS CATEGORY  1: Negative.

## 2021-03-03 ENCOUNTER — Other Ambulatory Visit: Payer: Self-pay | Admitting: Obstetrics and Gynecology

## 2021-03-03 DIAGNOSIS — Z1231 Encounter for screening mammogram for malignant neoplasm of breast: Secondary | ICD-10-CM

## 2021-04-08 ENCOUNTER — Ambulatory Visit
Admission: RE | Admit: 2021-04-08 | Discharge: 2021-04-08 | Disposition: A | Discharge status: Home / Self Care | Payer: BC Managed Care – PPO | Source: Ambulatory Visit | Attending: Obstetrics and Gynecology | Admitting: Obstetrics and Gynecology

## 2021-04-08 ENCOUNTER — Other Ambulatory Visit: Payer: Self-pay

## 2021-04-08 DIAGNOSIS — Z1231 Encounter for screening mammogram for malignant neoplasm of breast: Secondary | ICD-10-CM | POA: Insufficient documentation

## 2021-04-14 ENCOUNTER — Other Ambulatory Visit: Payer: Self-pay | Admitting: Obstetrics and Gynecology

## 2021-04-14 DIAGNOSIS — R928 Other abnormal and inconclusive findings on diagnostic imaging of breast: Secondary | ICD-10-CM

## 2021-04-14 DIAGNOSIS — N6489 Other specified disorders of breast: Secondary | ICD-10-CM

## 2021-04-25 ENCOUNTER — Ambulatory Visit: Admission: RE | Admit: 2021-04-25 | Payer: BC Managed Care – PPO | Source: Ambulatory Visit

## 2021-04-25 ENCOUNTER — Other Ambulatory Visit: Payer: BC Managed Care – PPO

## 2021-05-08 ENCOUNTER — Other Ambulatory Visit: Payer: Self-pay

## 2021-05-08 ENCOUNTER — Ambulatory Visit
Admission: RE | Admit: 2021-05-08 | Discharge: 2021-05-08 | Disposition: A | Discharge status: Home / Self Care | Payer: BC Managed Care – PPO | Source: Ambulatory Visit | Attending: Obstetrics and Gynecology | Admitting: Obstetrics and Gynecology

## 2021-05-08 DIAGNOSIS — R928 Other abnormal and inconclusive findings on diagnostic imaging of breast: Secondary | ICD-10-CM | POA: Diagnosis present

## 2021-05-08 DIAGNOSIS — N6489 Other specified disorders of breast: Secondary | ICD-10-CM

## 2021-05-23 ENCOUNTER — Other Ambulatory Visit: Payer: Self-pay | Admitting: Urology

## 2021-05-23 DIAGNOSIS — R350 Frequency of micturition: Secondary | ICD-10-CM

## 2021-08-24 HISTORY — PX: EYE SURGERY: SHX253

## 2021-10-31 ENCOUNTER — Ambulatory Visit: Payer: Self-pay | Admitting: Urology

## 2021-11-17 ENCOUNTER — Encounter: Payer: Self-pay | Admitting: Urology

## 2021-11-17 ENCOUNTER — Ambulatory Visit: Payer: BC Managed Care – PPO | Admitting: Urology

## 2021-11-17 ENCOUNTER — Other Ambulatory Visit: Payer: Self-pay

## 2021-11-17 VITALS — BP 120/78 | HR 86 | Ht 61.0 in | Wt 230.0 lb

## 2021-11-17 DIAGNOSIS — R350 Frequency of micturition: Secondary | ICD-10-CM | POA: Diagnosis not present

## 2021-11-17 DIAGNOSIS — N3281 Overactive bladder: Secondary | ICD-10-CM

## 2021-11-17 MED ORDER — SOLIFENACIN SUCCINATE 5 MG PO TABS: 5.0000 mg | ORAL_TABLET | Freq: Every day | ORAL | 3 refills | Status: DC

## 2021-11-17 MED ORDER — SOLIFENACIN SUCCINATE 5 MG PO TABS: 5.0000 mg | ORAL_TABLET | Freq: Every day | ORAL | 11 refills | Status: AC

## 2021-11-17 NOTE — Progress Notes (Signed)
? ?  11/17/2021 ?3:59 PM  ? ?Ann Perry ?09/12/1975 ?[id number] ? ?Reason for visit: Follow up OAB, stress incontinence ? ?HPI: ?I saw Ann Perry back in urology clinic for follow-up of overactive bladder.  She is a healthy 46 year old female who we started on Vesicare in March 2020 for urinary symptoms of urgency, frequency, and urge incontinence.  She has had excellent results on this medication and is no longer having any urge incontinence.  She has some occasional urgency that is minimally bothersome.  She has very mild stress incontinence with significant coughing or sneezing.  We discussed options including Kegel exercises, pelvic floor physical therapy, pessary, and urethral slings as options for stress incontinence, and she would like to start by focusing on Kegel exercises. ? ?She would like to continue with Vesicare 5 mg daily.  Behavioral strategies discussed including bladder irritants. ? ?Stable from a urology perspective, can follow-up with PCP for yearly Vesicare refills ? ? ?Sondra Come, MD ? ?[facility] ?[address] ?[place], [place] [address] ?[phone] ? ?

## 2021-11-17 NOTE — Patient Instructions (Signed)
Kegel Exercises ?Kegel exercises can help strengthen your pelvic floor muscles. The pelvic floor is a group of muscles that support your rectum, small intestine, and bladder. In females, pelvic floor muscles also help support the uterus. These muscles help you control the flow of urine and stool (feces). ?Kegel exercises are painless and simple. They do not require any equipment. Your provider may suggest Kegel exercises to: ?Improve bladder and bowel control. ?Improve sexual response. ?Improve weak pelvic floor muscles after surgery to remove the uterus (hysterectomy) or after pregnancy, in females. ?Improve weak pelvic floor muscles after prostate gland removal or surgery, in males. ?Kegel exercises involve squeezing your pelvic floor muscles. These are the same muscles you squeeze when you try to stop the flow of urine or keep from passing gas. The exercises can be done while sitting, standing, or lying down, but it is best to vary your position. ?Ask your health care provider which exercises are safe for you. Do exercises exactly as told by your health care provider and adjust them as directed. Do not begin these exercises until told by your health care provider. ?Exercises ?How to do Kegel exercises: ?Squeeze your pelvic floor muscles tight. You should feel a tight lift in your rectal area. If you are a female, you should also feel a tightness in your vaginal area. Keep your stomach, buttocks, and legs relaxed. ?Hold the muscles tight for up to 10 seconds. ?Breathe normally. ?Relax your muscles for up to 10 seconds. ?Repeat as told by your health care provider. ?Repeat this exercise daily as told by your health care provider. Continue to do this exercise for at least 4-6 weeks, or for as long as told by your health care provider. ?You may be referred to a physical therapist who can help you learn more about how to do Kegel exercises. ?Depending on your condition, your health care provider may  recommend: ?Varying how long you squeeze your muscles. ?Doing several sets of exercises every day. ?Doing exercises for several weeks. ?Making Kegel exercises a part of your regular exercise routine. ?This information is not intended to replace advice given to you by your health care provider. Make sure you discuss any questions you have with your health care provider. ?Document Revised: 12/19/2020 Document Reviewed: 12/19/2020 ?Elsevier Patient Education ? 2022 Elsevier Inc. ? ?

## 2022-03-11 ENCOUNTER — Other Ambulatory Visit: Payer: Self-pay | Admitting: Obstetrics and Gynecology

## 2022-03-11 DIAGNOSIS — Z1231 Encounter for screening mammogram for malignant neoplasm of breast: Secondary | ICD-10-CM

## 2022-04-09 ENCOUNTER — Ambulatory Visit
Admission: RE | Admit: 2022-04-09 | Discharge: 2022-04-09 | Disposition: A | Discharge status: Home / Self Care | Payer: BC Managed Care – PPO | Source: Ambulatory Visit | Attending: Obstetrics and Gynecology | Admitting: Obstetrics and Gynecology

## 2022-04-09 DIAGNOSIS — Z1231 Encounter for screening mammogram for malignant neoplasm of breast: Secondary | ICD-10-CM | POA: Diagnosis not present

## 2022-12-08 ENCOUNTER — Other Ambulatory Visit: Payer: Self-pay | Admitting: Urology

## 2022-12-08 DIAGNOSIS — R350 Frequency of micturition: Secondary | ICD-10-CM

## 2023-03-03 ENCOUNTER — Other Ambulatory Visit: Payer: Self-pay | Admitting: Internal Medicine

## 2023-03-03 DIAGNOSIS — Z1231 Encounter for screening mammogram for malignant neoplasm of breast: Secondary | ICD-10-CM

## 2023-04-13 ENCOUNTER — Ambulatory Visit
Admission: RE | Admit: 2023-04-13 | Discharge: 2023-04-13 | Disposition: A | Discharge status: Home / Self Care | Payer: BC Managed Care – PPO | Source: Ambulatory Visit | Attending: Internal Medicine | Admitting: Internal Medicine

## 2023-04-13 DIAGNOSIS — Z1231 Encounter for screening mammogram for malignant neoplasm of breast: Secondary | ICD-10-CM | POA: Insufficient documentation

## 2023-04-16 ENCOUNTER — Other Ambulatory Visit: Payer: Self-pay | Admitting: Internal Medicine

## 2023-04-16 DIAGNOSIS — N6452 Nipple discharge: Secondary | ICD-10-CM

## 2023-04-28 ENCOUNTER — Ambulatory Visit
Admission: RE | Admit: 2023-04-28 | Discharge: 2023-04-28 | Disposition: A | Discharge status: Home / Self Care | Payer: BC Managed Care – PPO | Source: Ambulatory Visit | Attending: Internal Medicine | Admitting: Internal Medicine

## 2023-04-28 DIAGNOSIS — N6452 Nipple discharge: Secondary | ICD-10-CM | POA: Diagnosis present

## 2023-05-03 ENCOUNTER — Other Ambulatory Visit: Payer: Self-pay | Admitting: Internal Medicine

## 2023-05-03 DIAGNOSIS — R928 Other abnormal and inconclusive findings on diagnostic imaging of breast: Secondary | ICD-10-CM

## 2023-05-03 DIAGNOSIS — N63 Unspecified lump in unspecified breast: Secondary | ICD-10-CM

## 2023-05-03 DIAGNOSIS — R599 Enlarged lymph nodes, unspecified: Secondary | ICD-10-CM

## 2023-05-10 ENCOUNTER — Ambulatory Visit
Admission: RE | Admit: 2023-05-10 | Discharge: 2023-05-10 | Disposition: A | Discharge status: Home / Self Care | Payer: BC Managed Care – PPO | Source: Ambulatory Visit | Attending: Internal Medicine | Admitting: Internal Medicine

## 2023-05-10 DIAGNOSIS — R599 Enlarged lymph nodes, unspecified: Secondary | ICD-10-CM | POA: Insufficient documentation

## 2023-05-10 DIAGNOSIS — R928 Other abnormal and inconclusive findings on diagnostic imaging of breast: Secondary | ICD-10-CM

## 2023-05-10 DIAGNOSIS — D242 Benign neoplasm of left breast: Secondary | ICD-10-CM | POA: Diagnosis present

## 2023-05-10 DIAGNOSIS — N63 Unspecified lump in unspecified breast: Secondary | ICD-10-CM | POA: Insufficient documentation

## 2023-05-10 HISTORY — PX: BREAST BIOPSY: SHX20

## 2023-05-10 MED ORDER — LIDOCAINE 1 % OPTIME INJ - NO CHARGE
5.0000 mL | Freq: Once | INTRAMUSCULAR | Status: AC
  Administered 2023-05-10: 5 mL
  Filled 2023-05-10: qty 6

## 2023-05-10 MED ORDER — LIDOCAINE-EPINEPHRINE 1 %-1:100000 IJ SOLN
10.0000 mL | Freq: Once | INTRAMUSCULAR | Status: AC
  Administered 2023-05-10: 10 mL
  Filled 2023-05-10: qty 10

## 2023-05-11 ENCOUNTER — Encounter: Payer: Self-pay | Admitting: *Deleted

## 2023-05-11 DIAGNOSIS — D242 Benign neoplasm of left breast: Secondary | ICD-10-CM

## 2023-05-11 LAB — SURGICAL PATHOLOGY

## 2023-05-11 NOTE — Progress Notes (Signed)
Referral recieved from [facility] for benign breast mass. Ms. Ann Perry would like to go to [facility], referral placed.  Their office will call with the appt.

## 2023-05-24 ENCOUNTER — Ambulatory Visit: Payer: BC Managed Care – PPO | Admitting: Surgery

## 2023-05-24 ENCOUNTER — Encounter: Payer: Self-pay | Admitting: Surgery

## 2023-05-24 VITALS — BP 117/81 | HR 78 | Temp 98.2°F | Ht 62.0 in | Wt 229.8 lb

## 2023-05-24 DIAGNOSIS — D0512 Intraductal carcinoma in situ of left breast: Secondary | ICD-10-CM | POA: Diagnosis not present

## 2023-05-24 DIAGNOSIS — N6452 Nipple discharge: Secondary | ICD-10-CM

## 2023-05-24 DIAGNOSIS — D369 Benign neoplasm, unspecified site: Secondary | ICD-10-CM

## 2023-05-24 NOTE — Patient Instructions (Signed)
We have spoken today about removing a lump in your breast. This will be done by Dr. Everlene Farrier at [facility].  You will most likely be able to leave the hospital several hours after your surgery. Rarely, a patient needs to stay over night but this is a possibility.  Plan to tentatively be off work for 1-2 weeks following the surgery and may return with approximately 2 more weeks of a lifting restriction, no greater than 15 lbs.  Please see your Blue surgery sheet for more information. Our surgery scheduler will call you to look at surgery dates and to go over information.   If you have FMLA or Disability paperwork that needs to be filled out, please have your company fax your paperwork to [phone] or you may drop this by either office. This paperwork will be filled out within 3 days after your surgery has been completed.    Lumpectomy A lumpectomy is a form of "breast conserving" or "breast preservation" surgery. It may also be referred to as a partial mastectomy. During a lumpectomy, the portion of the breast that contains the cancerous tumor or breast mass (the lump) is removed. Some normal tissue around the lump may also be removed to make sure all of the tumor has been removed.  LET [facility] CARE PROVIDER KNOW ABOUT: Any allergies you have. All medicines you are taking, including vitamins, herbs, eye drops, creams, and over-the-counter medicines. Previous problems you or members of your family have had with the use of anesthetics. Any blood disorders you have. Previous surgeries you have had. Medical conditions you have. RISKS AND COMPLICATIONS Generally, this is a safe procedure. However, problems can occur and include: Bleeding. Infection. Pain. Temporary swelling. Change in the shape of the breast, particularly if a large portion is removed. BEFORE THE PROCEDURE Ask your health care provider about changing or stopping your regular medicines. This is especially important if you are  taking diabetes medicines or blood thinners. Do not eat or drink anything after midnight on the night before the procedure or as directed by your health care provider. Ask your health care provider if you can take a sip of water with any approved medicines. On the day of surgery, your health care provider will use a mammogram or ultrasound to locate and mark the tumor in your breast. These markings on your breast will show where the cut (incision) will be made.   PROCEDURE  An IV tube will be put into one of your veins. You may be given medicine to help you relax before the surgery (sedative). You will be given one of the following: A medicine that numbs the area (local anesthetic). A medicine that makes you fall asleep (general anesthetic). Your health care provider will use a kind of electric scalpel that uses heat to minimize bleeding (electrocautery knife). A curved incision (like a smile or frown) that follows the natural curve of your breast is made, to allow for minimal scarring and better healing. The tumor will be removed with some of the surrounding tissue. This will be sent to the lab for analysis. Your health care provider may also remove your lymph nodes at this time if needed. Sometimes, but not always, a rubber tube called a drain will be surgically inserted into your breast area or armpit to collect excess fluid that may accumulate in the space where the tumor was. This drain is connected to a plastic bulb on the outside of your body. This drain creates suction to  help remove the fluid. The incisions will be closed with stitches (sutures). A bandage may be placed over the incisions. AFTER THE PROCEDURE You will be taken to the recovery area. You will be given medicine for pain. A small rubber drain may be placed in the breast for 2-3 days to prevent a collection of blood (hematoma) from developing in the breast. You will be given instructions on caring for the drain before you go  home. A pressure bandage (dressing) will be applied for 1-2 days to prevent bleeding. Ask your health care provider how to care for your bandage at home.   This information is not intended to replace advice given to you by your health care provider. Make sure you discuss any questions you have with your health care provider.   Document Released: 09/21/2006 Document Revised: 08/31/2014 Document Reviewed: 01/13/2013 Elsevier Interactive Patient Education [employer]

## 2023-05-26 ENCOUNTER — Encounter: Payer: Self-pay | Admitting: Surgery

## 2023-05-26 NOTE — H&P (View-Only) (Signed)
Patient ID: [name], female   DOB: 1976/03/06, 47 y.o.   MRN: [id number]  HPI Janeen AKIRA PERUSSE is a 47 y.o. female seen in consultation at the request of Dr. Hyacinth Meeker.  She endorses intermittent left breast serous sanguinous nipple discharge for the last few months.  Does not have a pattern.  It has been a while since she had her last nipple discharge.  She completed underwent appropriate mammogram and ultrasound that I have personally reviewed.  Mammogram was completely normal however ultrasound demonstrated a retroareolar mass measuring 8 x 6 mm.  This underwent appropriate biopsy.  Biopsy shows evidence of intraductal papilloma. Recent CBC and CMP is normal.  Hemoglobin A1c is 5.7 which is borderline Menarche at age 47 still menstruating.  No family history of breast cancer. One of her good friends at church was recently diagnosed and treated for breast cancer.  HPI  Past Medical History:  Diagnosis Date   Hypertension     Past Surgical History:  Procedure Laterality Date   ADENOIDECTOMY     BREAST BIOPSY Left 05/10/2023   left breast u/s mass heart clip retro path pending   BREAST BIOPSY Left 05/10/2023   [place] LT BREAST BX W LOC DEV 1ST LESION IMG BX SPEC US GUIDE 05/10/2023 [facility]   CHOLECYSTECTOMY      Family History  Problem Relation Age of Onset   Breast cancer Neg Hx     Social History Social History   Tobacco Use   Smoking status: Never   Smokeless tobacco: Never  Substance Use Topics   Alcohol use: Yes    Comment: social   Drug use: No    Allergies  Allergen Reactions   Penicillins Hives    Current Outpatient Medications  Medication Sig Dispense Refill   losartan-hydrochlorothiazide (HYZAAR) 50-12.5 MG tablet TAKE 1 TABLET BY MOUTH ONCE DAILY FOR BLOOD PRESSURE     solifenacin (VESICARE) 5 MG tablet Take 5 mg by mouth daily.     No current facility-administered medications for this visit.     Review of Systems Full ROS  was asked and  was negative except for the information on the HPI  Physical Exam Blood pressure 117/81, pulse 78, temperature 98.2 F (36.8 C), temperature source Oral, height 5\' 2"  (1.575 m), weight 229 lb 12.8 oz (104.2 kg), SpO2 99%.     Physical Exam Vitals and nursing note reviewed. Exam conducted with a chaperone present.  Constitutional:      General: She is not in acute distress.    Appearance: Normal appearance.  Eyes:     General: No scleral icterus.       Right eye: No discharge.        Left eye: No discharge.  Cardiovascular:     Rate and Rhythm: Normal rate and regular rhythm.  Pulmonary:     Effort: Pulmonary effort is normal. No respiratory distress.     Breath sounds: Normal breath sounds. No stridor.     Comments: Breast;There are no breast masses.  There are no evidence of any nipple or skin changes.  No LAD on either axillary basins   Abdominal:     General: Abdomen is flat. There is no distension.     Palpations: There is no mass.     Tenderness: There is no abdominal tenderness.     Hernia: No hernia is present.  Musculoskeletal:     Cervical back: Normal range of motion and neck supple.  Skin:  General: Skin is warm and dry.     Capillary Refill: Capillary refill takes less than 2 seconds.     Coloration: Skin is not jaundiced.  Neurological:     General: No focal deficit present.     Mental Status: She is alert and oriented to person, place, and time.  Psychiatric:        Mood and Affect: Mood normal.        Behavior: Behavior normal.        Thought Content: Thought content normal.        Judgment: Judgment normal.     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  Discussed with the patient in detail about her disease process.  Options of close follow-up versus central duct excision with excision of the intraductal papilloma  discussed with her and her husband in detail.  Given that she does have some chronic nipple discharge we can potentially address both the papilloma and the central duct excision.  Discussed with her in detail about the procedure.  Risks, benefits and possible complication.  She wishes to go ahead and proceed with excision of papilloma along with a central duct excisions.  We will try to see if breast radiology is able to place a Savi scout seed if not possible we may just go ahead and perform a central duct excision  Please note that I have spent 55 minutes in this encounter including personally reviewing imaging studies, placing orders, performing appropriate  documentation, counseling the patient. Copy of the report sent to referring provider.   Sterling Big, MD FACS General Surgeon 05/26/2023, 11:32 PM

## 2023-05-26 NOTE — Progress Notes (Signed)
Patient ID: Ann Perry, female   DOB: 1976/03/06, 47 y.o.   MRN: [id number]  HPI Ann Perry is a 48 y.o. female seen in consultation at the request of Dr. Hyacinth Meeker.  She endorses intermittent left breast serous sanguinous nipple discharge for the last few months.  Does not have a pattern.  It has been a while since she had her last nipple discharge.  She completed underwent appropriate mammogram and ultrasound that I have personally reviewed.  Mammogram was completely normal however ultrasound demonstrated a retroareolar mass measuring 8 x 6 mm.  This underwent appropriate biopsy.  Biopsy shows evidence of intraductal papilloma. Recent CBC and CMP is normal.  Hemoglobin A1c is 5.7 which is borderline Menarche at age 24 still menstruating.  No family history of breast cancer. One of her good friends at church was recently diagnosed and treated for breast cancer.  HPI  Past Medical History:  Diagnosis Date   Hypertension     Past Surgical History:  Procedure Laterality Date   ADENOIDECTOMY     BREAST BIOPSY Left 05/10/2023   left breast u/s mass heart clip retro path pending   BREAST BIOPSY Left 05/10/2023   [place] LT BREAST BX W LOC DEV 1ST LESION IMG BX SPEC US GUIDE 05/10/2023 [facility]   CHOLECYSTECTOMY      Family History  Problem Relation Age of Onset   Breast cancer Neg Hx     Social History Social History   Tobacco Use   Smoking status: Never   Smokeless tobacco: Never  Substance Use Topics   Alcohol use: Yes    Comment: social   Drug use: No    Allergies  Allergen Reactions   Penicillins Hives    Current Outpatient Medications  Medication Sig Dispense Refill   losartan-hydrochlorothiazide (HYZAAR) 50-12.5 MG tablet TAKE 1 TABLET BY MOUTH ONCE DAILY FOR BLOOD PRESSURE     solifenacin (VESICARE) 5 MG tablet Take 5 mg by mouth daily.     No current facility-administered medications for this visit.     Review of Systems Full ROS  was asked and  was negative except for the information on the HPI  Physical Exam Blood pressure 117/81, pulse 78, temperature 98.2 F (36.8 C), temperature source Oral, height 5\' 2"  (1.575 m), weight 229 lb 12.8 oz (104.2 kg), SpO2 99%.     Physical Exam Vitals and nursing note reviewed. Exam conducted with a chaperone present.  Constitutional:      General: She is not in acute distress.    Appearance: Normal appearance.  Eyes:     General: No scleral icterus.       Right eye: No discharge.        Left eye: No discharge.  Cardiovascular:     Rate and Rhythm: Normal rate and regular rhythm.  Pulmonary:     Effort: Pulmonary effort is normal. No respiratory distress.     Breath sounds: Normal breath sounds. No stridor.     Comments: Breast;There are no breast masses.  There are no evidence of any nipple or skin changes.  No LAD on either axillary basins   Abdominal:     General: Abdomen is flat. There is no distension.     Palpations: There is no mass.     Tenderness: There is no abdominal tenderness.     Hernia: No hernia is present.  Musculoskeletal:     Cervical back: Normal range of motion and neck supple.  Skin:  General: Skin is warm and dry.     Capillary Refill: Capillary refill takes less than 2 seconds.     Coloration: Skin is not jaundiced.  Neurological:     General: No focal deficit present.     Mental Status: She is alert and oriented to person, place, and time.  Psychiatric:        Mood and Affect: Mood normal.        Behavior: Behavior normal.        Thought Content: Thought content normal.        Judgment: Judgment normal.     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  Discussed with the patient in detail about her disease process.  Options of close follow-up versus central duct excision with excision of the intraductal papilloma  discussed with her and her husband in detail.  Given that she does have some chronic nipple discharge we can potentially address both the papilloma and the central duct excision.  Discussed with her in detail about the procedure.  Risks, benefits and possible complication.  She wishes to go ahead and proceed with excision of papilloma along with a central duct excisions.  We will try to see if breast radiology is able to place a Savi scout seed if not possible we may just go ahead and perform a central duct excision  Please note that I have spent 55 minutes in this encounter including personally reviewing imaging studies, placing orders, performing appropriate  documentation, counseling the patient. Copy of the report sent to referring provider.   Sterling Big, MD FACS General Surgeon 05/26/2023, 11:32 PM

## 2023-05-27 ENCOUNTER — Other Ambulatory Visit: Payer: Self-pay | Admitting: Surgery

## 2023-05-27 DIAGNOSIS — D242 Benign neoplasm of left breast: Secondary | ICD-10-CM

## 2023-05-28 ENCOUNTER — Telehealth: Payer: Self-pay | Admitting: Surgery

## 2023-05-28 NOTE — Telephone Encounter (Signed)
Patient has been advised of Pre-Admission date/time, and Surgery date at [facility].  Surgery Date: 06/17/23 Preadmission Testing Date: 06/09/23 (phone 1p-4p)  Patient has been made aware to call [phone], between 1-3:00pm the day before surgery, to find out what time to arrive for surgery.

## 2023-06-09 ENCOUNTER — Other Ambulatory Visit: Payer: Self-pay

## 2023-06-09 ENCOUNTER — Ambulatory Visit
Admission: RE | Admit: 2023-06-09 | Discharge: 2023-06-09 | Disposition: A | Discharge status: Home / Self Care | Payer: BC Managed Care – PPO | Source: Ambulatory Visit | Attending: Surgery | Admitting: Surgery

## 2023-06-09 ENCOUNTER — Inpatient Hospital Stay
Admission: RE | Admit: 2023-06-09 | Discharge: 2023-06-09 | Disposition: A | Discharge status: Home / Self Care | Payer: BC Managed Care – PPO | Source: Ambulatory Visit | Attending: Surgery

## 2023-06-09 DIAGNOSIS — N921 Excessive and frequent menstruation with irregular cycle: Secondary | ICD-10-CM

## 2023-06-09 DIAGNOSIS — D242 Benign neoplasm of left breast: Secondary | ICD-10-CM | POA: Diagnosis present

## 2023-06-09 DIAGNOSIS — D510 Vitamin B12 deficiency anemia due to intrinsic factor deficiency: Secondary | ICD-10-CM

## 2023-06-09 DIAGNOSIS — I10 Essential (primary) hypertension: Secondary | ICD-10-CM

## 2023-06-09 HISTORY — DX: Headache, unspecified: R51.9

## 2023-06-09 HISTORY — DX: Pneumonia, unspecified organism: J18.9

## 2023-06-09 HISTORY — DX: Anemia, unspecified: D64.9

## 2023-06-09 HISTORY — PX: BREAST BIOPSY: SHX20

## 2023-06-09 HISTORY — DX: Overactive bladder: N32.81

## 2023-06-09 MED ORDER — LIDOCAINE HCL 1 % IJ SOLN
5.0000 mL | Freq: Once | INTRAMUSCULAR | Status: AC
  Administered 2023-06-09: 5 mL
  Filled 2023-06-09: qty 5

## 2023-06-09 NOTE — Patient Instructions (Addendum)
Your procedure is scheduled on: 06/17/23 - Thursday Report to the Registration Desk on the 1st floor of the [facility]. To find out your arrival time, please call [phone] between 1PM - 3PM on: 06/16/23 - Wednesday If your arrival time is 6:00 am, do not arrive before that time as the [facility] entrance doors do not open until 6:00 am.  REMEMBER: Instructions that are not followed completely may result in serious medical risk, up to and including death; or upon the discretion of your surgeon and anesthesiologist your surgery may need to be rescheduled.  Do not eat food after midnight the night before surgery.  No gum chewing or hard candies.  You may however, drink CLEAR liquids up to 2 hours before you are scheduled to arrive for your surgery. Do not drink anything within 2 hours of your scheduled arrival time.  Clear liquids include: - water  - apple juice without pulp - gatorade (not RED colors) - black coffee or tea (Do NOT add milk or creamers to the coffee or tea) Do NOT drink anything that is not on this list.   One week prior to surgery: Stop Anti-inflammatories (NSAIDS) such as Advil, Aleve, Ibuprofen, Motrin, Naproxen, Naprosyn and Aspirin based products such as Excedrin, Goody's Powder, BC Powder. You may take Tylenol if needed for pain up until the day of surgery.  Stop ANY OVER THE COUNTER supplements until after surgery.  ON THE DAY OF SURGERY ONLY TAKE THESE MEDICATIONS WITH SIPS OF WATER:  solifenacin (VESICARE)    No Alcohol for 24 hours before or after surgery.  No Smoking including e-cigarettes for 24 hours before surgery.  No chewable tobacco products for at least 6 hours before surgery.  No nicotine patches on the day of surgery.  Do not use any "recreational" drugs for at least a week (preferably 2 weeks) before your surgery.  Please be advised that the combination of cocaine and anesthesia may have negative outcomes, up to and including  death. If you test positive for cocaine, your surgery will be cancelled.  On the morning of surgery brush your teeth with toothpaste and water, you may rinse your mouth with mouthwash if you wish. Do not swallow any toothpaste or mouthwash.  Use CHG Soap or wipes as directed on instruction sheet.  Do not wear jewelry, make-up, hairpins, clips or nail polish.  For welded (permanent) jewelry: bracelets, anklets, waist bands, etc.  Please have this removed prior to surgery.  If it is not removed, there is a chance that hospital personnel will need to cut it off on the day of surgery.  Do not wear lotions, powders, or perfumes.   Do not shave body hair from the neck down 48 hours before surgery.  Contact lenses, hearing aids and dentures may not be worn into surgery.  Do not bring valuables to the hospital. [facility] is not responsible for any missing/lost belongings or valuables.   Notify your doctor if there is any change in your medical condition (cold, fever, infection).  Wear comfortable clothing (specific to your surgery type) to the hospital.  After surgery, you can help prevent lung complications by doing breathing exercises.  Take deep breaths and cough every 1-2 hours. Your doctor may order a device called an Incentive Spirometer to help you take deep breaths. When coughing or sneezing, hold a pillow firmly against your incision with both hands. This is called "splinting." Doing this helps protect your incision. It also decreases belly discomfort.  If you are being admitted to the hospital overnight, leave your suitcase in the car. After surgery it may be brought to your room.  In case of increased patient census, it may be necessary for you, the patient, to continue your postoperative care in the Same Day Surgery department.  If you are being discharged the day of surgery, you will not be allowed to drive home. You will need a responsible individual to drive you home and  stay with you for 24 hours after surgery.   If you are taking public transportation, you will need to have a responsible individual with you.  Please call the Pre-admissions Testing Dept. at [phone] if you have any questions about these instructions.  Surgery Visitation Policy:  Patients having surgery or a procedure may have two visitors.  Children under the age of 44 must have an adult with them who is not the patient.  Inpatient Visitation:    Visiting hours are 7 a.m. to 8 p.m. Up to four visitors are allowed at one time in a patient room. The visitors may rotate out with other people during the day.  One visitor age 50 or older may stay with the patient overnight and must be in the room by 8 p.m.     Preparing for Surgery with CHLORHEXIDINE GLUCONATE (CHG) Soap  Chlorhexidine Gluconate (CHG) Soap  o An antiseptic cleaner that kills germs and bonds with the skin to continue killing germs even after washing  o Used for showering the night before surgery and morning of surgery  Before surgery, you can play an important role by reducing the number of germs on your skin.  CHG (Chlorhexidine gluconate) soap is an antiseptic cleanser which kills germs and bonds with the skin to continue killing germs even after washing.  Please do not use if you have an allergy to CHG or antibacterial soaps. If your skin becomes reddened/irritated stop using the CHG.  1. Shower the NIGHT BEFORE SURGERY and the MORNING OF SURGERY with CHG soap.  2. If you choose to wash your hair, wash your hair first as usual with your normal shampoo.  3. After shampooing, rinse your hair and body thoroughly to remove the shampoo.  4. Use CHG as you would any other liquid soap. You can apply CHG directly to the skin and wash gently with a scrungie or a clean washcloth.  5. Apply the CHG soap to your body only from the neck down. Do not use on open wounds or open sores. Avoid contact with your eyes, ears,  mouth, and genitals (private parts). Wash face and genitals (private parts) with your normal soap.  6. Wash thoroughly, paying special attention to the area where your surgery will be performed.  7. Thoroughly rinse your body with warm water.  8. Do not shower/wash with your normal soap after using and rinsing off the CHG soap.  9. Pat yourself dry with a clean towel.  10. Wear clean pajamas to bed the night before surgery.  12. Place clean sheets on your bed the night of your first shower and do not sleep with pets.  13. Shower again with the CHG soap on the day of surgery prior to arriving at the hospital.  14. Do not apply any deodorants/lotions/powders.  15. Please wear clean clothes to the hospital.

## 2023-06-14 ENCOUNTER — Encounter
Admission: RE | Admit: 2023-06-14 | Discharge: 2023-06-14 | Disposition: A | Discharge status: Home / Self Care | Payer: BC Managed Care – PPO | Source: Ambulatory Visit | Attending: Surgery | Admitting: Surgery

## 2023-06-14 DIAGNOSIS — Z6841 Body Mass Index (BMI) 40.0 and over, adult: Secondary | ICD-10-CM | POA: Diagnosis not present

## 2023-06-14 DIAGNOSIS — N6452 Nipple discharge: Secondary | ICD-10-CM | POA: Diagnosis present

## 2023-06-14 DIAGNOSIS — D510 Vitamin B12 deficiency anemia due to intrinsic factor deficiency: Secondary | ICD-10-CM | POA: Insufficient documentation

## 2023-06-14 DIAGNOSIS — D242 Benign neoplasm of left breast: Secondary | ICD-10-CM | POA: Diagnosis not present

## 2023-06-14 DIAGNOSIS — I10 Essential (primary) hypertension: Secondary | ICD-10-CM | POA: Insufficient documentation

## 2023-06-14 DIAGNOSIS — Z01818 Encounter for other preprocedural examination: Secondary | ICD-10-CM | POA: Insufficient documentation

## 2023-06-14 DIAGNOSIS — N921 Excessive and frequent menstruation with irregular cycle: Secondary | ICD-10-CM | POA: Insufficient documentation

## 2023-06-14 DIAGNOSIS — N3281 Overactive bladder: Secondary | ICD-10-CM | POA: Diagnosis not present

## 2023-06-14 LAB — CBC
HCT: 41.1 % (ref 36.0–46.0)
Hemoglobin: 13.1 g/dL (ref 12.0–15.0)
MCH: 27.6 pg (ref 26.0–34.0)
MCHC: 31.9 g/dL (ref 30.0–36.0)
MCV: 86.7 fL (ref 80.0–100.0)
Platelets: 282 10*3/uL (ref 150–400)
RBC: 4.74 MIL/uL (ref 3.87–5.11)
RDW: 13.2 % (ref 11.5–15.5)
WBC: 8.2 10*3/uL (ref 4.0–10.5)
nRBC: 0 % (ref 0.0–0.2)

## 2023-06-14 LAB — BASIC METABOLIC PANEL
Anion gap: 8 (ref 5–15)
BUN: 12 mg/dL (ref 6–20)
CO2: 30 mmol/L (ref 22–32)
Calcium: 9.3 mg/dL (ref 8.9–10.3)
Chloride: 97 mmol/L — ABNORMAL LOW (ref 98–111)
Creatinine, Ser: 0.78 mg/dL (ref 0.44–1.00)
GFR, Estimated: >60 mL/min (ref >60)
Glucose, Bld: 107 mg/dL — ABNORMAL HIGH (ref 70–99)
Potassium: 3.6 mmol/L (ref 3.5–5.1)
Sodium: 135 mmol/L (ref 135–145)

## 2023-06-17 ENCOUNTER — Encounter: Payer: Self-pay | Admitting: Surgery

## 2023-06-17 ENCOUNTER — Ambulatory Visit
Admission: RE | Admit: 2023-06-17 | Discharge: 2023-06-17 | Disposition: A | Discharge status: Home / Self Care | Payer: BC Managed Care – PPO | Source: Ambulatory Visit | Attending: Surgery | Admitting: Surgery

## 2023-06-17 ENCOUNTER — Ambulatory Visit: Payer: BC Managed Care – PPO | Admitting: Anesthesiology

## 2023-06-17 ENCOUNTER — Encounter
Admission: RE | Disposition: A | Discharge status: Home / Self Care | Payer: Self-pay | Source: Home / Self Care | Attending: Surgery

## 2023-06-17 ENCOUNTER — Ambulatory Visit: Payer: BC Managed Care – PPO | Admitting: Urgent Care

## 2023-06-17 ENCOUNTER — Other Ambulatory Visit: Payer: Self-pay

## 2023-06-17 ENCOUNTER — Ambulatory Visit
Admission: RE | Admit: 2023-06-17 | Discharge: 2023-06-17 | Disposition: A | Discharge status: Home / Self Care | Payer: BC Managed Care – PPO | Attending: Surgery | Admitting: Surgery

## 2023-06-17 DIAGNOSIS — I10 Essential (primary) hypertension: Secondary | ICD-10-CM | POA: Insufficient documentation

## 2023-06-17 DIAGNOSIS — D242 Benign neoplasm of left breast: Secondary | ICD-10-CM | POA: Insufficient documentation

## 2023-06-17 DIAGNOSIS — Z6841 Body Mass Index (BMI) 40.0 and over, adult: Secondary | ICD-10-CM | POA: Insufficient documentation

## 2023-06-17 DIAGNOSIS — N3281 Overactive bladder: Secondary | ICD-10-CM | POA: Insufficient documentation

## 2023-06-17 DIAGNOSIS — N6452 Nipple discharge: Secondary | ICD-10-CM | POA: Diagnosis not present

## 2023-06-17 DIAGNOSIS — D369 Benign neoplasm, unspecified site: Secondary | ICD-10-CM

## 2023-06-17 HISTORY — PX: BREAST LUMPECTOMY WITH RADIOFREQUENCY TAG IDENTIFICATION: SHX6884

## 2023-06-17 LAB — POCT PREGNANCY, URINE: Preg Test, Ur: NEGATIVE

## 2023-06-17 SURGERY — BREAST LUMPECTOMY WITH RADIOFREQUENCY TAG IDENTIFICATION
Anesthesia: General | Site: Breast | Laterality: Left

## 2023-06-17 MED ORDER — MIDAZOLAM HCL 2 MG/2ML IJ SOLN
INTRAMUSCULAR | Status: DC | PRN
  Administered 2023-06-17: 2 mg via INTRAVENOUS

## 2023-06-17 MED ORDER — DEXAMETHASONE SODIUM PHOSPHATE 10 MG/ML IJ SOLN
INTRAMUSCULAR | Status: DC | PRN
  Administered 2023-06-17: 10 mg via INTRAVENOUS

## 2023-06-17 MED ORDER — CELECOXIB 200 MG PO CAPS
ORAL_CAPSULE | ORAL | Status: AC
  Filled 2023-06-17: qty 1

## 2023-06-17 MED ORDER — PROPOFOL 10 MG/ML IV BOLUS
INTRAVENOUS | Status: DC | PRN
  Administered 2023-06-17: 160 mg via INTRAVENOUS

## 2023-06-17 MED ORDER — SUGAMMADEX SODIUM 200 MG/2ML IV SOLN
INTRAVENOUS | Status: DC | PRN
  Administered 2023-06-17: 200 mg via INTRAVENOUS

## 2023-06-17 MED ORDER — ROCURONIUM BROMIDE 100 MG/10ML IV SOLN
INTRAVENOUS | Status: DC | PRN
  Administered 2023-06-17: 40 mg via INTRAVENOUS
  Administered 2023-06-17: 15 mg via INTRAVENOUS

## 2023-06-17 MED ORDER — FENTANYL CITRATE (PF) 100 MCG/2ML IJ SOLN
INTRAMUSCULAR | Status: DC | PRN
  Administered 2023-06-17 (×2): 50 ug via INTRAVENOUS

## 2023-06-17 MED ORDER — HYDROCODONE-ACETAMINOPHEN 5-325 MG PO TABS: 1.0000 | ORAL_TABLET | ORAL | 0 refills | Status: DC | PRN

## 2023-06-17 MED ORDER — DEXAMETHASONE SODIUM PHOSPHATE 10 MG/ML IJ SOLN
INTRAMUSCULAR | Status: AC
  Filled 2023-06-17: qty 1

## 2023-06-17 MED ORDER — GABAPENTIN 300 MG PO CAPS
300.0000 mg | ORAL_CAPSULE | ORAL | Status: AC
  Administered 2023-06-17: 300 mg via ORAL

## 2023-06-17 MED ORDER — ORAL CARE MOUTH RINSE: 15.0000 mL | Freq: Once | OROMUCOSAL | Status: AC

## 2023-06-17 MED ORDER — PHENYLEPHRINE 80 MCG/ML (10ML) SYRINGE FOR IV PUSH (FOR BLOOD PRESSURE SUPPORT)
PREFILLED_SYRINGE | INTRAVENOUS | Status: DC | PRN
  Administered 2023-06-17 (×2): 160 ug via INTRAVENOUS
  Administered 2023-06-17: 120 ug via INTRAVENOUS
  Administered 2023-06-17 (×4): 160 ug via INTRAVENOUS

## 2023-06-17 MED ORDER — BUPIVACAINE-EPINEPHRINE (PF) 0.25% -1:200000 IJ SOLN
INTRAMUSCULAR | Status: AC
  Filled 2023-06-17: qty 30

## 2023-06-17 MED ORDER — PHENYLEPHRINE 80 MCG/ML (10ML) SYRINGE FOR IV PUSH (FOR BLOOD PRESSURE SUPPORT)
PREFILLED_SYRINGE | INTRAVENOUS | Status: AC
  Filled 2023-06-17: qty 10

## 2023-06-17 MED ORDER — CHLORHEXIDINE GLUCONATE CLOTH 2 % EX PADS: 6.0000 | MEDICATED_PAD | Freq: Once | CUTANEOUS | Status: DC

## 2023-06-17 MED ORDER — FENTANYL CITRATE (PF) 100 MCG/2ML IJ SOLN
25.0000 ug | INTRAMUSCULAR | Status: DC | PRN
  Administered 2023-06-17 (×2): 25 ug via INTRAVENOUS

## 2023-06-17 MED ORDER — BUPIVACAINE-EPINEPHRINE 0.25% -1:200000 IJ SOLN
INTRAMUSCULAR | Status: DC | PRN
  Administered 2023-06-17: 11 mL

## 2023-06-17 MED ORDER — ONDANSETRON HCL 4 MG/2ML IJ SOLN
INTRAMUSCULAR | Status: DC | PRN
  Administered 2023-06-17: 4 mg via INTRAVENOUS

## 2023-06-17 MED ORDER — CHLORHEXIDINE GLUCONATE 0.12 % MT SOLN
15.0000 mL | Freq: Once | OROMUCOSAL | Status: AC
  Administered 2023-06-17: 15 mL via OROMUCOSAL

## 2023-06-17 MED ORDER — CEFAZOLIN SODIUM-DEXTROSE 2-4 GM/100ML-% IV SOLN
2.0000 g | INTRAVENOUS | Status: AC
  Administered 2023-06-17: 2 g via INTRAVENOUS

## 2023-06-17 MED ORDER — GABAPENTIN 300 MG PO CAPS
ORAL_CAPSULE | ORAL | Status: AC
  Filled 2023-06-17: qty 1

## 2023-06-17 MED ORDER — LACTATED RINGERS IV SOLN: INTRAVENOUS | Status: DC

## 2023-06-17 MED ORDER — CELECOXIB 200 MG PO CAPS
200.0000 mg | ORAL_CAPSULE | ORAL | Status: AC
  Administered 2023-06-17: 200 mg via ORAL

## 2023-06-17 MED ORDER — CEFAZOLIN SODIUM-DEXTROSE 2-4 GM/100ML-% IV SOLN
INTRAVENOUS | Status: AC
  Filled 2023-06-17: qty 100

## 2023-06-17 MED ORDER — ACETAMINOPHEN 500 MG PO TABS
ORAL_TABLET | ORAL | Status: AC
  Filled 2023-06-17: qty 2

## 2023-06-17 MED ORDER — ONDANSETRON HCL 4 MG/2ML IJ SOLN: 4.0000 mg | Freq: Once | INTRAMUSCULAR | Status: DC | PRN

## 2023-06-17 MED ORDER — ONDANSETRON HCL 4 MG/2ML IJ SOLN
INTRAMUSCULAR | Status: AC
  Filled 2023-06-17: qty 2

## 2023-06-17 MED ORDER — BUPIVACAINE LIPOSOME 1.3 % IJ SUSP
INTRAMUSCULAR | Status: AC
  Filled 2023-06-17: qty 20

## 2023-06-17 MED ORDER — FENTANYL CITRATE (PF) 100 MCG/2ML IJ SOLN
INTRAMUSCULAR | Status: AC
  Filled 2023-06-17: qty 2

## 2023-06-17 MED ORDER — ACETAMINOPHEN 500 MG PO TABS
1000.0000 mg | ORAL_TABLET | ORAL | Status: AC
  Administered 2023-06-17: 1000 mg via ORAL

## 2023-06-17 MED ORDER — LIDOCAINE HCL (CARDIAC) PF 100 MG/5ML IV SOSY
PREFILLED_SYRINGE | INTRAVENOUS | Status: DC | PRN
  Administered 2023-06-17: 50 mg via INTRAVENOUS

## 2023-06-17 MED ORDER — PROPOFOL 10 MG/ML IV BOLUS
INTRAVENOUS | Status: AC
  Filled 2023-06-17: qty 20

## 2023-06-17 MED ORDER — LIDOCAINE HCL (PF) 2 % IJ SOLN
INTRAMUSCULAR | Status: AC
  Filled 2023-06-17: qty 5

## 2023-06-17 MED ORDER — CHLORHEXIDINE GLUCONATE 0.12 % MT SOLN
OROMUCOSAL | Status: AC
  Filled 2023-06-17: qty 15

## 2023-06-17 MED ORDER — ROCURONIUM BROMIDE 10 MG/ML (PF) SYRINGE
PREFILLED_SYRINGE | INTRAVENOUS | Status: AC
  Filled 2023-06-17: qty 10

## 2023-06-17 MED ORDER — MIDAZOLAM HCL 2 MG/2ML IJ SOLN
INTRAMUSCULAR | Status: AC
Start: 2023-06-17 — End: ?
  Filled 2023-06-17: qty 2

## 2023-06-17 SURGICAL SUPPLY — 37 items
ADH SKN CLS APL DERMABOND .7 (GAUZE/BANDAGES/DRESSINGS) ×1
APL PRP FLT STRL LF DISP 70% (MISCELLANEOUS) ×2
APPLICATOR CHLORAPREP 10 TEAL (MISCELLANEOUS) ×1 IMPLANT
APPLIER CLIP 9.375 SM OPEN (CLIP)
APR CLP SM 9.3 20 MLT OPN (CLIP)
CLIP APPLIE 9.375 SM OPEN (CLIP) IMPLANT
DERMABOND ADVANCED .7 DNX12 (GAUZE/BANDAGES/DRESSINGS) ×1 IMPLANT
DEVICE DUBIN SPECIMEN MAMMOGRA (MISCELLANEOUS) ×1 IMPLANT
DRAPE CHEST BREAST 77X106 FENE (MISCELLANEOUS) ×1 IMPLANT
ELECT CAUTERY BLADE 6.4 (BLADE) ×1 IMPLANT
ELECT REM PT RETURN 9FT ADLT (ELECTROSURGICAL) ×1
ELECTRODE REM PT RTRN 9FT ADLT (ELECTROSURGICAL) ×1 IMPLANT
GLOVE BIO SURGEON STRL SZ7 (GLOVE) ×1 IMPLANT
GOWN STRL REUS W/ TWL LRG LVL3 (GOWN DISPOSABLE) ×2 IMPLANT
GOWN STRL REUS W/TWL LRG LVL3 (GOWN DISPOSABLE) ×6
KIT MARKER MARGIN INK (KITS) IMPLANT
KIT TURNOVER KIT A (KITS) ×1 IMPLANT
MANIFOLD NEPTUNE II (INSTRUMENTS) ×1 IMPLANT
MARGIN MAP 10MM (MISCELLANEOUS) IMPLANT
MARKER MARGIN CORRECT CLIP (MARKER) IMPLANT
NDL HYPO 22X1.5 SAFETY MO (MISCELLANEOUS) ×1 IMPLANT
NEEDLE HYPO 22X1.5 SAFETY MO (MISCELLANEOUS) ×1 IMPLANT
PACK BASIN MINOR ARMC (MISCELLANEOUS) ×1 IMPLANT
SHEATH BREAST BIOPSY SKIN MKR (SHEATH) ×1 IMPLANT
SPONGE T-LAP 18X18 ~~LOC~~+RFID (SPONGE) ×1 IMPLANT
SUT MNCRL 4-0 (SUTURE) ×1
SUT MNCRL 4-0 27XMFL (SUTURE) ×1
SUT SILK 2 0 SH (SUTURE) IMPLANT
SUT VIC AB 2-0 SH 27 (SUTURE) ×4
SUT VIC AB 2-0 SH 27XBRD (SUTURE) ×2 IMPLANT
SUT VIC AB 3-0 SH 27 (SUTURE) ×1
SUT VIC AB 3-0 SH 27X BRD (SUTURE) ×2 IMPLANT
SUTURE MNCRL 4-0 27XMF (SUTURE) ×2 IMPLANT
TRAP FLUID SMOKE EVACUATOR (MISCELLANEOUS) ×1 IMPLANT
TRAP NEPTUNE SPECIMEN COLLECT (MISCELLANEOUS) ×1 IMPLANT
WATER STERILE IRR 1000ML POUR (IV SOLUTION) ×1 IMPLANT
WATER STERILE IRR 500ML POUR (IV SOLUTION) ×1 IMPLANT

## 2023-06-17 NOTE — Anesthesia Procedure Notes (Signed)
Procedure Name: Intubation Date/Time: 06/17/2023 9:09 AM  Performed by: Jeannene Patella, CRNAPre-anesthesia Checklist: Patient identified, Emergency Drugs available, Suction available, Patient being monitored and Timeout performed Patient Re-evaluated:Patient Re-evaluated prior to induction Preoxygenation: Pre-oxygenation with 100% oxygen Induction Type: IV induction Ventilation: Mask ventilation with difficulty and Oral airway inserted - appropriate to patient size Laryngoscope Size: McGraph and 3 Grade View: Grade II Tube type: Oral Tube size: 6.5 mm Number of attempts: 1 Airway Equipment and Method: Stylet, Video-laryngoscopy and LTA kit utilized Placement Confirmation: ETT inserted through vocal cords under direct vision, positive ETCO2 and breath sounds checked- equal and bilateral Secured at: 21 (@ lip) cm Tube secured with: Tape Dental Injury: Teeth and Oropharynx as per pre-operative assessment

## 2023-06-17 NOTE — Discharge Instructions (Addendum)
Lumpectomy, Care After The following information offers guidance on how to care for yourself after your procedure. Your health care provider may also give you more specific instructions. If you have problems or questions, contact your health care provider. What can I expect after the procedure? After the procedure, it is common to have: Some pain or redness at the incision site. Breast swelling. Breast tenderness. Stiffness in your arm or shoulder. A change in the shape and feel of your breast. Scar tissue that feels hard to the touch in the area where the lump was removed. Follow these instructions at home: Medicines Take over-the-counter and prescription medicines only as told by your health care provider. If you were prescribed an antibiotic, take it as told by your health care provider. Do not stop taking the antibiotic even if you start to feel better. Ask your health care provider if the medicine prescribed to you: Requires you to avoid driving or using machinery. Can cause constipation. You may need to take these actions to prevent or treat constipation: Drink enough fluid to keep your urine pale yellow. Take over-the-counter or prescription medicines. Eat foods that are high in fiber, such as beans, whole grains, and fresh fruits and vegetables. Limit foods that are high in fat and processed sugars, such as fried or sweet foods. Incision care     Follow instructions from your health care provider about how to take care of your incision. Make sure you: Wash your hands with soap and water for at least 20 seconds before and after you change your bandage (dressing). If soap and water are not available, use hand sanitizer. Change your dressing as told by your health care provider. Leave stitches (sutures), skin glue, or adhesive strips in place. These skin closures may need to stay in place for 2 weeks or longer. If adhesive strip edges start to loosen and curl up, you may trim the  loose edges. Do not remove adhesive strips completely unless your health care provider tells you to do that. Check your incision area every day for signs of infection. Check for: More redness, swelling, or pain. Fluid or blood. Warmth. Pus or a bad smell. Keep your dressing clean and dry. If you were sent home with a surgical drain in place, follow instructions from your health care provider about emptying it. Bathing Do not take baths, swim, or use a hot tub until your health care provider approves. Ask your health care provider if you may take showers. You may only be allowed to take sponge baths. Activity Rest as told by your health care provider. Do not sit for a long time without moving. Get up to take short walks every 1-2 hours. This will improve blood flow and breathing. Ask for help if you feel weak or unsteady. Be careful to avoid any activities that could cause an injury to your arm on the side of your surgery. Do not lift anything that is heavier than 10 lb (4.5 kg), or the limit that you are told, until your health care provider says that it is safe. Avoid lifting with the arm that is on the side of your surgery. Do not carry heavy objects on your shoulder on the side of your surgery. Do exercises to keep your shoulder and arm from getting stiff and swollen. Talk with your health care provider about which exercises are safe for you. Return to your normal activities as told by your health care provider. Ask your health care provider what activities  are safe for you. General instructions Wear a supportive bra as told by your health care provider. Raise (elevate) your arm above the level of your heart while you are sitting or lying down. Do not wear tight jewelry on your arm, wrist, or fingers on the side of your surgery. Wear compression stockings as told by your health care provider. These stockings help to prevent blood clots and reduce swelling in your legs. If you had any lymph  nodes removed during your procedure, be sure to tell all of your health care providers. It is important to share this information before you have certain procedures, such as blood tests or blood pressure measurements. Keep all follow-up visits. You may need to be screened for extra fluid around the lymph nodes and swelling in the breast and arm (lymphedema). Contact a health care provider if: You develop a rash. You have a fever. Your pain worsens or pain medicine is not working. You have swelling, weakness, or numbness in your arm that does not improve after a few weeks. You have new swelling in your breast. You have any of these signs of infection: More redness, swelling, or pain in your incision area. Fluid or blood coming from your incision. Warmth coming from the incision area. Pus or a bad smell coming from your incision. Get help right away if: You have very bad pain in your breast or arm. You have swelling in your legs or arms. You have redness, warmth, or pain in your leg or arm. You have chest pain. You have difficulty breathing. These symptoms may be an emergency. Get help right away. Call 911. Do not wait to see if the symptoms will go away. Do not drive yourself to the hospital. Summary After the procedure, it is common to have breast tenderness, swelling in your breast, and stiffness in your arm and shoulder. Follow instructions from your health care provider about how to take care of your incision. Do not lift anything that is heavier than 10 lb (4.5 kg), or the limit that you are told, until your health care provider says that it is safe. Avoid lifting with the arm that is on the side of your surgery. If you had any lymph nodes removed during your procedure, be sure to tell all of your health care providers. This information is not intended to replace advice given to you by your health care provider. Make sure you discuss any questions you have with your health care  provider. Document Revised: 10/19/2021 Document Reviewed: 10/19/2021 Elsevier Patient Education  2024 [employer]  AMBULATORY SURGERY  DISCHARGE INSTRUCTIONS   The drugs that you were given will stay in your system until tomorrow so for the next 24 hours you should not:  Drive an automobile Make any legal decisions Drink any alcoholic beverage   You may resume regular meals tomorrow.  Today it is better to start with liquids and gradually work up to solid foods.  You may eat anything you prefer, but it is better to start with liquids, then soup and crackers, and gradually work up to solid foods.   Please notify your doctor immediately if you have any unusual bleeding, trouble breathing, redness and pain at the surgery site, drainage, fever, or pain not relieved by medication.    Additional Instructions:        Please contact your physician with any problems or Same Day Surgery at [phone], Monday through Friday 6 am to 4 pm, or [place] at [facility]  Main number at [phone].

## 2023-06-17 NOTE — Op Note (Signed)
Pre-operative Diagnosis: Left abnormal nipple discharge with intraductal papilloma    Post-operative Diagnosis: Same   Surgeon: Sterling Big,  MD FACS  Anesthesia: GETA  Procedure: Left Partial mastectomy central duct excision, Savi scout guided,  Complex closure with tissue rearrangements measuring 64 cm2 ( 8x8cm)   Findings: Clip and lesion within xray specimen   Estimated Blood Loss: Minimal         Drains: None         Specimens: partial mastectomy margins painted       Complications: none                 Condition: Stable   Procedure Details  The patient was seen again in the Holding Room. The benefits, complications, treatment options, and expected outcomes were discussed with the patient. The risks of bleeding, infection, recurrence of symptoms, failure to resolve symptoms, hematoma, seroma, open wound, cosmetic deformity, and the need for further surgery were discussed.  The patient was taken to Operating Room, identified  and the procedure verified.  A Time Out was held and the above information confirmed.  Prior to the induction of general anesthesia, antibiotic prophylaxis was administered. VTE prophylaxis was in place. Appropriate anesthesia was then administered and tolerated well. I used the savi scout probe to localize the area of maximal cadence and Marked the area it was right behind the nipple. The chest was prepped with Chloraprep and draped in the sterile fashion. The patient was positioned in the supine position.  Periareolar incision was made in the left breast after identifying again the point of maximal cadence. Circumferential flaps performed with cautery, I was very careful on inferior flap to get behin the nipple and stayed superficial as I knew the lesion was superficial. The breast tissue was elevated and after a good circumferential flap I took a figure of eight stitch of the breast tissue. Partial mastectomy with adequate margins was performed using  cautery.  Please note that the savi scout was very superficial right behing the nipple, it migrated given its superficial location. I placed it back into the breast tissue. We noted several ducts with breast DC. The specimen was label, marked, we obtained faxitron images confirming appropriate excision, specimen was sent to permanent pathology . Tissue advancement flaps were performed to decrease the volume deficit in the area of the resection.  Please note that the total void area was 8 x 8 cm equals 64 cm.  The chest wall flaps were created by incising the breast parenchyma from the pectoralis fascia in a circumferential method.  The breast parenchyma was then reapproximated in a deep to superficial fashion using interrupted 2-0 Vicryl sutures.  Please note that I placed 2 deep layers of 2-0 Vicryl's. Once assuring that hemostasis was adequate and checked multiple times the wound was closed with 4-0 subcuticular Monocryl sutures. Dermabond was placed  Patient was taken to the recovery room in stable condition.   Sterling Big , MD, FACS

## 2023-06-17 NOTE — Anesthesia Preprocedure Evaluation (Signed)
Anesthesia Evaluation  Patient identified by MRN, date of birth, ID band Patient awake    Reviewed: Allergy & Precautions, NPO status , Patient's Chart, lab work & pertinent test results  Airway Mallampati: III  TM Distance: >3 FB Neck ROM: full    Dental  (+) Teeth Intact   Pulmonary neg pulmonary ROS   Pulmonary exam normal breath sounds clear to auscultation       Cardiovascular Exercise Tolerance: Good hypertension, Pt. on medications negative cardio ROS Normal cardiovascular exam Rhythm:Regular Rate:Normal     Neuro/Psych  Headaches negative neurological ROS  negative psych ROS   GI/Hepatic negative GI ROS, Neg liver ROS,,,  Endo/Other  negative endocrine ROS  Morbid obesity  Renal/GU negative Renal ROS  negative genitourinary   Musculoskeletal   Abdominal  (+) + obese  Peds negative pediatric ROS (+)  Hematology negative hematology ROS (+) Blood dyscrasia, anemia   Anesthesia Other Findings Past Medical History: No date: Anemia No date: Headache No date: Hypertension No date: Overactive bladder No date: Pneumonia  Past Surgical History: No date: ADENOIDECTOMY 05/10/2023: BREAST BIOPSY; Left     Comment:  left breast u/s mass heart clip retro path pending 05/10/2023: BREAST BIOPSY; Left     Comment:  [place] LT BREAST BX W LOC DEV 1ST LESION IMG BX SPEC [place]               GUIDE 05/10/2023 [facility] 06/09/2023: BREAST BIOPSY; Left     Comment:  [place] LT RADIO FREQUENCY TAG LOC US GUIDE 06/09/2023               [facility] No date: CHOLECYSTECTOMY 2023: EYE SURGERY     Comment:  cataract bilateral     Reproductive/Obstetrics negative OB ROS                             Anesthesia Physical Anesthesia Plan  ASA: 3  Anesthesia Plan: General   Post-op Pain Management:    Induction: Intravenous  PONV Risk Score and Plan: 1 and Ondansetron and Dexamethasone  Airway  Management Planned: LMA and Oral ETT  Additional Equipment:   Intra-op Plan:   Post-operative Plan: Extubation in OR  Informed Consent: I have reviewed the patients History and Physical, chart, labs and discussed the procedure including the risks, benefits and alternatives for the proposed anesthesia with the patient or authorized representative who has indicated his/her understanding and acceptance.     Dental Advisory Given  Plan Discussed with: CRNA and Surgeon  Anesthesia Plan Comments:        Anesthesia Quick Evaluation

## 2023-06-17 NOTE — Interval H&P Note (Signed)
History and Physical Interval Note:  06/17/2023 8:20 AM  Ann Perry  has presented today for surgery, with the diagnosis of breast papilloma.  The various methods of treatment have been discussed with the patient and family. After consideration of risks, benefits and other options for treatment, the patient has consented to  Procedure(s) with comments: BREAST LUMPECTOMY WITH RADIOFREQUENCY TAG IDENTIFICATION (Left) - SAVI scout tag, central duct excision as a surgical intervention.  The patient's history has been reviewed, patient examined, no change in status, stable for surgery.  I have reviewed the patient's chart and labs.  Questions were answered to the patient's satisfaction.     Ilea Hilton F Laurent Cargile

## 2023-06-17 NOTE — Addendum Note (Signed)
Addendum  created 06/17/23 1249 by Jeannene Patella, CRNA   Flowsheet accepted

## 2023-06-17 NOTE — Anesthesia Postprocedure Evaluation (Signed)
Anesthesia Post Note  Patient: Ann Perry  Procedure(s) Performed: BREAST LUMPECTOMY WITH RADIOFREQUENCY TAG IDENTIFICATION (Left: Breast)  Patient location during evaluation: PACU Anesthesia Type: General Level of consciousness: awake Pain management: satisfactory to patient Vital Signs Assessment: post-procedure vital signs reviewed and stable Respiratory status: spontaneous breathing Cardiovascular status: stable Anesthetic complications: no   No notable events documented.   Last Vitals:  Vitals:   06/17/23 1115 06/17/23 1123  BP: 103/68   Pulse: 81 83  Resp: (!) 21 19  Temp:  (!) 36.1 C  SpO2: 90% 93%    Last Pain:  Vitals:   06/17/23 1115  TempSrc:   PainSc: 2                  VAN STAVEREN,Milla Wahlberg

## 2023-06-17 NOTE — Transfer of Care (Signed)
Immediate Anesthesia Transfer of Care Note  Patient: Ann Perry  Procedure(s) Performed: BREAST LUMPECTOMY WITH RADIOFREQUENCY TAG IDENTIFICATION (Left: Breast)  Patient Location: PACU  Anesthesia Type:General  Level of Consciousness: awake, oriented, and patient cooperative  Airway & Oxygen Therapy: Patient Spontanous Breathing  Post-op Assessment: Report given to RN and Post -op Vital signs reviewed and stable  Post vital signs: Reviewed and stable  Last Vitals:  Vitals Value Taken Time  BP 112/67 06/17/23 1034  Temp    Pulse 81 06/17/23 1038  Resp 8 06/17/23 1038  SpO2 91 % 06/17/23 1038  Vitals shown include unfiled device data.  Last Pain:  Vitals:   06/17/23 0835  TempSrc: Oral  PainSc: 0-No pain         Complications: No notable events documented.

## 2023-06-18 ENCOUNTER — Telehealth: Payer: Self-pay

## 2023-06-18 LAB — SURGICAL PATHOLOGY

## 2023-06-18 NOTE — Telephone Encounter (Signed)
-----   Message from [facility] sent at 06/18/2023 10:18 AM EDT ----- Please let her know that pathology from breast is benign, No cancer found . Congratulations ----- Message ----- From: Interface, [facility] Sent: 06/18/2023   9:51 AM EDT To: Leafy Ro, MD

## 2023-06-18 NOTE — Telephone Encounter (Signed)
Notified patient as instructed, patient pleased. Discussed follow-up appointments, patient agrees  

## 2023-06-30 ENCOUNTER — Ambulatory Visit (INDEPENDENT_AMBULATORY_CARE_PROVIDER_SITE_OTHER): Payer: BC Managed Care – PPO | Admitting: Surgery

## 2023-06-30 ENCOUNTER — Encounter: Payer: Self-pay | Admitting: Surgery

## 2023-06-30 VITALS — BP 129/85 | HR 86 | Temp 98.0°F | Ht 62.0 in | Wt 228.0 lb

## 2023-06-30 DIAGNOSIS — Z09 Encounter for follow-up examination after completed treatment for conditions other than malignant neoplasm: Secondary | ICD-10-CM

## 2023-06-30 DIAGNOSIS — D242 Benign neoplasm of left breast: Secondary | ICD-10-CM

## 2023-06-30 NOTE — Progress Notes (Signed)
47 year old female 2 weeks out from central duct excision of left breast.  Pathology discussed with her in detail.  She is doing well.  No fevers no chills she is very appreciative.  Physical exam: Chaperone present Breast incision healing well without evidence of infection seromas or expanding hematomas.  No significant deformity  A/P doing very well after lumpectomy central duct excision.  I will be happy to see her in a couple months.  No evidence of surgical complications

## 2023-06-30 NOTE — Patient Instructions (Signed)
Follow up here in about 3 months for a recheck. We will send you a letter about this appointment.     Please call and ask to speak with a nurse if you develop questions or concerns.    Continue self breast exams. Call office for any new breast issues or concerns.

## 2023-10-11 ENCOUNTER — Encounter: Payer: Self-pay | Admitting: Surgery

## 2023-10-11 ENCOUNTER — Ambulatory Visit: Payer: 59 | Admitting: Surgery

## 2023-10-11 VITALS — BP 121/78 | HR 84 | Temp 98.2°F | Ht 62.0 in | Wt 231.0 lb

## 2023-10-11 DIAGNOSIS — D242 Benign neoplasm of left breast: Secondary | ICD-10-CM | POA: Diagnosis not present

## 2023-10-11 DIAGNOSIS — D369 Benign neoplasm, unspecified site: Secondary | ICD-10-CM

## 2023-10-11 DIAGNOSIS — Z09 Encounter for follow-up examination after completed treatment for conditions other than malignant neoplasm: Secondary | ICD-10-CM | POA: Diagnosis not present

## 2023-10-11 NOTE — Patient Instructions (Addendum)
 Patient will be asked to return to the office in September with a bilateral screening mammogram. We will send you a letter about these appointments.   Continue self breast exams. Call office for any new breast issues or concerns.

## 2023-10-15 NOTE — Progress Notes (Signed)
 Outpatient Surgical Follow Up    Ann Perry is an 48 y.o. female.   Chief Complaint  Patient presents with   Follow-up    [id number] is a very pleasant 48 year old female 4 months out from central duct excision of left breast. Pathology was bening. She is doing well. No fevers no chills she is very appreciative.   Past Medical History:  Diagnosis Date   Anemia    Headache    Hypertension    Overactive bladder    Pneumonia     Past Surgical History:  Procedure Laterality Date   ADENOIDECTOMY     BREAST BIOPSY Left 05/10/2023   left breast u/s mass heart clip retro path pending   BREAST BIOPSY Left 05/10/2023   [place] LT BREAST BX W LOC DEV 1ST LESION IMG BX SPEC US GUIDE 05/10/2023 [facility]   BREAST BIOPSY Left 06/09/2023   [place] LT RADIO FREQUENCY TAG LOC US GUIDE 06/09/2023 [facility]   BREAST LUMPECTOMY WITH RADIOFREQUENCY TAG IDENTIFICATION Left 06/17/2023   Procedure: BREAST LUMPECTOMY WITH RADIOFREQUENCY TAG IDENTIFICATION;  Surgeon: Leafy Ro, MD;  Location: [facility];  Service: General;  Laterality: Left;  SAVI scout tag, central duct excision   CHOLECYSTECTOMY     EYE SURGERY  2023   cataract bilateral    Family History  Problem Relation Age of Onset   Breast cancer Neg Hx     Social History:  reports that she has never smoked. She has never been exposed to tobacco smoke. She has never used smokeless tobacco. She reports current alcohol use. She reports that she does not use drugs.  Allergies:  Allergies  Allergen Reactions   Penicillins Hives    Medications reviewed.    ROS Full ROS performed and is otherwise negative other than what is stated in HPI   BP 121/78   Pulse 84   Temp 98.2 F (36.8 C)   Ht 5\' 2"  (1.575 m)   Wt 231 lb (104.8 kg)   SpO2 99%   BMI 42.25 kg/m   Physical Exam Physical Exam Vitals and nursing note reviewed. Exam conducted with a chaperone present.  Constitutional:      General: She is not in  acute distress.    Appearance: Normal appearance.  Eyes:     General: No scleral icterus.       Right eye: No discharge.        Left eye: No discharge.  Cardiovascular:     Rate and Rhythm: Normal rate and regular rhythm.  Pulmonary:     Effort: Pulmonary effort is normal. No respiratory distress.     Breath sounds: Normal breath sounds. No stridor.     Comments: Breast;There are no breast masses.  There are no evidence of any nipple or skin changes.  No LAD on either axillary basins. Breast scar healing well, no breast deformity  Abdominal:     General: Abdomen is flat. There is no distension.     Palpations: There is no mass.     Tenderness: There is no abdominal tenderness.     Hernia: No hernia is present.  Musculoskeletal:     Cervical back: Normal range of motion and neck supple.  Skin:    General: Skin is warm and dry.     Capillary Refill: Capillary refill takes less than 2 seconds.     Coloration: Skin is not jaundiced.  Neurological:     General: No focal deficit present.     Mental Status:  She is alert and oriented to person, place, and time.  Psychiatric:        Mood and Affect: Mood normal.        Behavior: Behavior normal.        Thought Content: Thought content normal.        Judgment: Judgment normal.  Assessment/Plan: 48 year old pleasant female with prior intraductal papilloma s/p centyral duct excision and breast discharge that has completely resolved.  No evidence of complications. We Will be happy to follow her up after she completes her mammogram yearly. Please note that I have spent 20 minutes in this encounter including personally reviewing imaging studies, medical record, placing orders, performing appropriate  documentation and  counseling the patient.   Sterling Big, MD [facility] General Surgeon

## 2024-03-08 ENCOUNTER — Other Ambulatory Visit: Payer: Self-pay

## 2024-03-08 DIAGNOSIS — Z1231 Encounter for screening mammogram for malignant neoplasm of breast: Secondary | ICD-10-CM

## 2024-03-26 ENCOUNTER — Emergency Department

## 2024-03-26 ENCOUNTER — Emergency Department
Admission: EM | Admit: 2024-03-26 | Discharge: 2024-03-26 | Disposition: A | Discharge status: Home / Self Care | Attending: Emergency Medicine | Admitting: Emergency Medicine

## 2024-03-26 ENCOUNTER — Other Ambulatory Visit: Payer: Self-pay

## 2024-03-26 DIAGNOSIS — I471 Supraventricular tachycardia, unspecified: Secondary | ICD-10-CM | POA: Insufficient documentation

## 2024-03-26 DIAGNOSIS — I10 Essential (primary) hypertension: Secondary | ICD-10-CM | POA: Insufficient documentation

## 2024-03-26 DIAGNOSIS — R0789 Other chest pain: Secondary | ICD-10-CM | POA: Diagnosis present

## 2024-03-26 LAB — TROPONIN I (HIGH SENSITIVITY)
Troponin I (High Sensitivity): 9 ng/L (ref <18)
Troponin I (High Sensitivity): 9 ng/L (ref <18)

## 2024-03-26 LAB — BASIC METABOLIC PANEL WITH GFR
Anion gap: 10 (ref 5–15)
BUN: 11 mg/dL (ref 6–20)
CO2: 25 mmol/L (ref 22–32)
Calcium: 9.6 mg/dL (ref 8.9–10.3)
Chloride: 104 mmol/L (ref 98–111)
Creatinine, Ser: 0.78 mg/dL (ref 0.44–1.00)
GFR, Estimated: >60 mL/min (ref >60)
Glucose, Bld: 107 mg/dL — ABNORMAL HIGH (ref 70–99)
Potassium: 3.6 mmol/L (ref 3.5–5.1)
Sodium: 139 mmol/L (ref 135–145)

## 2024-03-26 LAB — CBC
HCT: 44.7 % (ref 36.0–46.0)
Hemoglobin: 14.3 g/dL (ref 12.0–15.0)
MCH: 27.4 pg (ref 26.0–34.0)
MCHC: 32 g/dL (ref 30.0–36.0)
MCV: 85.6 fL (ref 80.0–100.0)
Platelets: 247 K/uL (ref 150–400)
RBC: 5.22 MIL/uL — ABNORMAL HIGH (ref 3.87–5.11)
RDW: 13.4 % (ref 11.5–15.5)
WBC: 6.4 K/uL (ref 4.0–10.5)
nRBC: 0 % (ref 0.0–0.2)

## 2024-03-26 LAB — MAGNESIUM: Magnesium: 2 mg/dL (ref 1.7–2.4)

## 2024-03-26 MED ORDER — IOHEXOL 350 MG/ML SOLN
75.0000 mL | Freq: Once | INTRAVENOUS | Status: AC | PRN
  Administered 2024-03-26: 75 mL via INTRAVENOUS

## 2024-03-26 MED ORDER — METOPROLOL TARTRATE 25 MG PO TABS: 25.0000 mg | ORAL_TABLET | Freq: Two times a day (BID) | ORAL | 11 refills | Status: AC

## 2024-03-26 MED ORDER — METOPROLOL TARTRATE 25 MG PO TABS
25.0000 mg | ORAL_TABLET | Freq: Once | ORAL | Status: AC
  Administered 2024-03-26: 25 mg via ORAL
  Filled 2024-03-26: qty 1

## 2024-03-26 MED ORDER — METOPROLOL TARTRATE 5 MG/5ML IV SOLN
5.0000 mg | Freq: Once | INTRAVENOUS | Status: AC
  Administered 2024-03-26: 5 mg via INTRAVENOUS

## 2024-03-26 MED ORDER — LACTATED RINGERS IV BOLUS
1000.0000 mL | Freq: Once | INTRAVENOUS | Status: AC
  Administered 2024-03-26: 1000 mL via INTRAVENOUS

## 2024-03-26 MED ORDER — ADENOSINE 6 MG/2ML IV SOLN
INTRAVENOUS | Status: AC
  Filled 2024-03-26: qty 2

## 2024-03-26 MED ORDER — ADENOSINE 12 MG/4ML IV SOLN
INTRAVENOUS | Status: AC
  Filled 2024-03-26: qty 4

## 2024-03-26 MED ORDER — METOPROLOL TARTRATE 5 MG/5ML IV SOLN
INTRAVENOUS | Status: AC
  Filled 2024-03-26: qty 5

## 2024-03-26 NOTE — ED Provider Notes (Signed)
 [facility] Provider Note    Event Date/Time   First MD Initiated Contact with Patient 03/26/24 1016     (approximate)   History   Chest Pain   HPI  Ann Perry is a 48 y.o. female who presents to the ED for evaluation of Chest Pain   I review a routine PCP visit from April.  Obese patient with history of HTN, breast lumpectomy for benign intraductal papilloma.  Patient presents for evaluation of generalized weakness, feeling flushed and right-sided chest discomfort for the past 12 hours or so.  Symptoms started last night while she was seated at home, went to lay down and got a little bit better and she was able to sleep last night, recurred again this morning so she presents to the ED.   Physical Exam   Triage Vital Signs: ED Triage Vitals [03/26/24 1013]  Encounter Vitals Group     BP      Girls Systolic BP Percentile      Girls Diastolic BP Percentile      Boys Systolic BP Percentile      Boys Diastolic BP Percentile      Pulse      Resp      Temp      Temp src      SpO2      Weight      Height      Head Circumference      Peak Flow      Pain Score 5     Pain Loc      Pain Education      Exclude from Growth Chart     Most recent vital signs: Vitals:   03/26/24 1200 03/26/24 1230  BP: 118/85 123/79  Pulse: 76 68  Resp: 17 16  Temp:    SpO2: 100% 100%    General: Awake, no distress.  Sitting upright and looks well, conversational CV:  Good peripheral perfusion.  Quite tachycardic, SVT on the monitor with rates near 180 Resp:  Normal effort.  Abd:  No distention.  MSK:  No deformity noted.  Neuro:  No focal deficits appreciated. Other:     ED Results / Procedures / Treatments   Labs (all labs ordered are listed, but only abnormal results are displayed) Labs Reviewed  BASIC METABOLIC PANEL WITH GFR - Abnormal; Notable for the following components:      Result Value   Glucose, Bld 107 (*)    All other components  within normal limits  CBC - Abnormal; Notable for the following components:   RBC 5.22 (*)    All other components within normal limits  MAGNESIUM  POC URINE PREG, ED  TROPONIN I (HIGH SENSITIVITY)  TROPONIN I (HIGH SENSITIVITY)    EKG First EKG with narrow complex rapid and regular rate of 171 concerning for SVT.  Nonspecific changes without STEMI.  Repeat EKG demonstrates normal sinus rhythm with a rate of 70 bpm, normal axis and intervals without clear signs of acute ischemia.  RADIOLOGY CXR interpreted by me without evidence of acute cardiopulmonary pathology. CTA chest interpreted by me without signs of PE  Official radiology report(s): CT Angio Chest PE W and/or Wo Contrast Result Date: 03/26/2024 CLINICAL DATA:  Right-sided chest pain with new onset SVT. Chest pain beginning last night. Possible pulmonary embolism. EXAM: CT ANGIOGRAPHY CHEST WITH CONTRAST TECHNIQUE: Multidetector CT imaging of the chest was performed using the standard protocol during bolus administration of intravenous contrast.  Multiplanar CT image reconstructions and MIPs were obtained to evaluate the vascular anatomy. RADIATION DOSE REDUCTION: This exam was performed according to the departmental dose-optimization program which includes automated exposure control, adjustment of the mA and/or kV according to patient size and/or use of iterative reconstruction technique. CONTRAST:  75mL OMNIPAQUE  IOHEXOL  350 MG/ML SOLN COMPARISON:  Chest x-ray 03/26/2024 and CT abdomen 10/19/2017 FINDINGS: Cardiovascular: Mild cardiomegaly. Subtle calcified plaque over the left anterior descending coronary artery. Thoracic aorta is normal in caliber. Pulmonary arterial system is well opacified and demonstrates no evidence of emboli. Remaining vascular structures are unremarkable. Mediastinum/Nodes: No mediastinal or hilar adenopathy. Remaining mediastinal structures are unremarkable. Lungs/Pleura: Lungs are adequately inflated without  acute airspace process or effusion. Airways are normal. Upper Abdomen: Prior cholecystectomy.  No acute findings. Musculoskeletal: Postsurgical changes over the left retroareolar region. No focal abnormality. Review of the MIP images confirms the above findings. IMPRESSION: 1. No acute cardiopulmonary disease and no evidence of pulmonary embolism. 2. Mild cardiomegaly. Subtle calcified plaque over the left anterior descending coronary artery. Electronically Signed   By: Toribio Agreste M.D.   On: 03/26/2024 12:11   DG Chest Port 1 View Result Date: 03/26/2024 CLINICAL DATA:  [id number] Chest pain [id number] EXAM: PORTABLE CHEST 1 VIEW COMPARISON:  None Available. FINDINGS: Bilateral lung fields are clear. Bilateral costophrenic angles are clear. Normal cardio-mediastinal silhouette. No acute osseous abnormalities. The soft tissues are within normal limits. IMPRESSION: No active disease. Electronically Signed   By: Ree Molt M.D.   On: 03/26/2024 10:41    PROCEDURES and INTERVENTIONS:  .Critical Care  Performed by: Claudene Rover, MD Authorized by: Claudene Rover, MD   Critical care provider statement:    Critical care time (minutes):  30   Critical care time was exclusive of:  Separately billable procedures and treating other patients   Critical care was necessary to treat or prevent imminent or life-threatening deterioration of the following conditions:  Cardiac failure and circulatory failure   Critical care was time spent personally by me on the following activities:  Development of treatment plan with patient or surrogate, discussions with consultants, evaluation of patient's response to treatment, examination of patient, ordering and review of laboratory studies, ordering and review of radiographic studies, ordering and performing treatments and interventions, pulse oximetry, re-evaluation of patient's condition and review of old charts .1-3 Lead EKG Interpretation  Performed by: Claudene Rover,  MD Authorized by: Claudene Rover, MD     Interpretation: abnormal     ECG rate:  180   ECG rate assessment: tachycardic     Rhythm: SVT     Ectopy: none     Conduction: normal     Medications  metoprolol  tartrate (LOPRESSOR ) tablet 25 mg (25 mg Oral Given 03/26/24 1032)  metoprolol  tartrate (LOPRESSOR ) injection 5 mg (5 mg Intravenous Not Given 03/26/24 1036)  lactated ringers  bolus 1,000 mL (0 mLs Intravenous Stopped 03/26/24 1210)  iohexol  (OMNIPAQUE ) 350 MG/ML injection 75 mL (75 mLs Intravenous Contrast Given 03/26/24 1138)     IMPRESSION / MDM / ASSESSMENT AND PLAN / ED COURSE  I reviewed the triage vital signs and the nursing notes.  Differential diagnosis includes, but is not limited to, ACS, PTX, PNA, muscle strain/spasm, PE, dissection, anxiety, pleural effusion  {Patient presents with symptoms of an acute illness or injury that is potentially life-threatening.  Patient presents with symptomatic SVT resolving with initiation of metoprolol  and ultimately suitable for outpatient management with cardiology follow-up.  Presents symptomatic with rapid rates  but hemodynamically stable.    Initially consider adenosine , as nurses are pulling this we attempted modified vagal maneuvers that seemed successful initially improving her to a normal sinus rhythm with rates in the 90s.  But shortly after that she developed back and SVT.  No underlying A-fib appreciated.  Provide IV and oral metoprolol  with good effect, she subsequently remains in normal sinus rhythm with rate 70s-80s without recurrence of any rapid rates.  She has normal electrolytes, negative troponins, clear CXR and CTA chest without signs of PE.  I consult with cardiology, as below.  Discharged on metoprolol  tartrate  Clinical Course as of 03/26/24 1303  Sun Mar 26, 2024  1027 Modified vagal maneuvers are transiently successful.  Improved heart rate from 170s/180s --> sinus rhythm in the 90s and we are able to print out a  twelve-lead EKG with this slower NSR.  Only lasting 1-2 minutes.  She had improved symptoms with this, but quickly devolves back to rapid rates 170s-180s and reports feeling generalized unwell.  BP remained stable. [DS]  1044 Responds well to the metoprolol .  NSR with rates in the 70s and maintains normal blood pressure. [DS]  1226 I consult with Dr. Florencio, cardiology.  Agrees with prescribing metoprolol  tartrate 25 mg twice daily as an outpatient.  Provided his information and they can follow-up this week with him in the clinic [DS]  1256 ECG Heart Rate: 70 Reassessed and discussed workup overall, SVT, medications, cardiology follow-up, as needed metoprolol  for any recurrent episodes, ED return precautions.  Answered questions.  Patient suitable for outpatient management [DS]    Clinical Course User Index [DS] Claudene Rover, MD     FINAL CLINICAL IMPRESSION(S) / ED DIAGNOSES   Final diagnoses:  SVT (supraventricular tachycardia) (HCC)     Rx / DC Orders   ED Discharge Orders          Ordered    metoprolol  tartrate (LOPRESSOR ) 25 MG tablet  2 times daily        03/26/24 1258             Note:  This document was prepared using Dragon voice recognition software and may include unintentional dictation errors.   Claudene Rover, MD 03/26/24 [phone]

## 2024-03-26 NOTE — Discharge Instructions (Signed)
 As we discussed, please start taking metoprolol /Lopressor  twice per day every day to help prevent these episodes of very fast heart rate from happening.  You can take a single extra dose as needed if you have any recurrence of these episodes.  Follow-up with Dr. Florencio in the clinic, local cardiologist.  Call his office tomorrow during business hours  Return to the ED with any worsening symptoms or recurrence of SVT not resolved with the extra dose of metoprolol  at home.

## 2024-03-26 NOTE — ED Triage Notes (Signed)
 Pt comes in via pov with complaints of chest pain that started last night. Pt took some tums last night and fell asleep. Pt woke up this morning is still continuing to have right upper chest pain 5/10. Pt has the sensation of her heart racing. Pt's HR in the 170's in triage.

## 2024-05-15 ENCOUNTER — Ambulatory Visit: Admitting: Surgery

## 2024-05-23 ENCOUNTER — Ambulatory Visit
Admission: RE | Admit: 2024-05-23 | Discharge: 2024-05-23 | Disposition: A | Discharge status: Home / Self Care | Payer: Self-pay | Source: Ambulatory Visit | Attending: Surgery | Admitting: Surgery

## 2024-05-23 DIAGNOSIS — Z1231 Encounter for screening mammogram for malignant neoplasm of breast: Secondary | ICD-10-CM | POA: Insufficient documentation

## 2024-05-31 ENCOUNTER — Ambulatory Visit: Admitting: Surgery

## 2024-06-05 ENCOUNTER — Ambulatory Visit: Admitting: Surgery

## 2024-06-19 ENCOUNTER — Ambulatory Visit: Admitting: Surgery

## 2024-06-19 ENCOUNTER — Encounter: Payer: Self-pay | Admitting: Surgery

## 2024-06-19 VITALS — BP 115/76 | HR 84 | Ht 62.0 in | Wt 230.0 lb

## 2024-06-19 DIAGNOSIS — D242 Benign neoplasm of left breast: Secondary | ICD-10-CM

## 2024-06-19 DIAGNOSIS — Z09 Encounter for follow-up examination after completed treatment for conditions other than malignant neoplasm: Secondary | ICD-10-CM

## 2024-06-19 DIAGNOSIS — D369 Benign neoplasm, unspecified site: Secondary | ICD-10-CM

## 2024-06-19 NOTE — Patient Instructions (Signed)
 Patient will be asked to return to the office in one year with a bilateral screening mammogram.  Continue self breast exams. Call office for any new breast issues or concerns.    How to Do a Breast Self-Exam Doing breast self-exams can help you stay healthy. They're one way to learn what's normal for your breasts. You should check your breasts often and tell your health care provider about any changes. Sometimes, changes aren't harmful. Other times, a change may be a sign of a serious medical problem. Breast self-exams can help you catch a problem while it's still small and can be treated. You should do breast self-exams even if you have breast implants. What you need: A mirror. A well-lit room. A pillow or other soft object. How to do a breast self-exam Look for changes  Take off all the clothing above your waist. Stand in front of a mirror in a room with good lighting. Put your hands down at your sides. Compare your breasts in the mirror. Look for differences between them, such as: Differences in shape. Differences in size. Puckers, dips, and bumps in one breast and not the other. Look at each breast for skin changes, such as: Redness. Scaly spots. Spots where your skin is thicker. Dimpling. Open sores. Look for changes in your nipples, such as: Discharge. Bleeding. Dimpling. Redness. A nipple that looks pushed in or that has changed position. Feel for changes Gently feel your breasts for lumps and changes. It's best to do this while lying down. Follow these steps to feel each breast: Place a pillow under the shoulder of one side of your body. Place the arm of that side of your body behind your head. Feel the breast of that side of your body using the hand of your other arm. To do this: Start near your nipple and use the pads of your three middle fingers to make -inch (2 cm) circles that overlap. Use light, medium, and then firm pressure as you feel your breast, gently  going over the whole breast area and armpit. Keep making circles, moving down over the breast until you feel your ribs below your breast. Then, make circles with your fingers going up until you reach your collarbone. Next, make circles by moving out across your breast and into your armpit area. Squeeze the nipple. Check for discharge and lumps. Repeat steps 1-7 to check your other breast. Sit or stand in the tub or shower. With soapy water on your skin, feel each breast the same way you did when you were lying down. Write down what you find Writing down what you find can help you keep track of what you want to tell your provider. Write down: What's normal for each breast. Any changes that you find. Write down: The kind of change. If you feel any pain or tenderness. How big any lumps are. Where any lumps are. Where you are in your menstrual cycle, if you get a period. General tips If you're breastfeeding, the best time to check your breasts is after a feeding or after using a breast pump. If you get a period, the best time to check your breasts is 5-7 days after your period. Breasts are often lumpier during your period, and it may be harder to notice changes. With time and practice, you'll get more familiar with the differences in your breasts and get more used to doing the self-exam. Contact a health care provider if: You see a change in the shape or  size of your breasts or nipples. You see a change in the skin of your breast or nipples. You have discharge from your nipples. You find a new lump or thick area. You have breast pain. You have any concerns about your breast health. This information is not intended to replace advice given to you by your health care provider. Make sure you discuss any questions you have with your health care provider. Document Revised: 10/20/2023 Document Reviewed: 10/20/2023 Elsevier Patient Education  2025 [employer].

## 2024-06-19 NOTE — Progress Notes (Signed)
 Outpatient Surgical Follow Up  06/19/2024  Ann Perry is an 48 y.o. female.   Chief Complaint  Patient presents with   Follow-up    HPI: Ann Perry is a very pleasant 48 year old female 1 year  out from central duct excision of left breast. Pathology was bening. She is doing well. No fevers no chills she is very appreciative.  She had a recent mammo that I have pr. Reviewed showing no concerning lesions . No breast concerns, no pain, no lumps, She did have recent chest pain and dysrhythmia. No PE, seen by Cards.    Past Medical History:  Diagnosis Date   Anemia    Headache    Hypertension    Overactive bladder    Pneumonia     Past Surgical History:  Procedure Laterality Date   ADENOIDECTOMY     BREAST BIOPSY Left 05/10/2023   left breast u/s mass heart clip retro path pending   BREAST BIOPSY Left 05/10/2023   US  LT BREAST BX W LOC DEV 1ST LESION IMG BX SPEC US  GUIDE 05/10/2023 [facility]   BREAST BIOPSY Left 06/09/2023   US  LT RADIO FREQUENCY TAG LOC US  GUIDE 06/09/2023 [facility]   BREAST LUMPECTOMY WITH RADIOFREQUENCY TAG IDENTIFICATION Left 06/17/2023   Procedure: BREAST LUMPECTOMY WITH RADIOFREQUENCY TAG IDENTIFICATION;  Surgeon: Jordis Laneta FALCON, MD;  Location: [facility];  Service: General;  Laterality: Left;  SAVI scout tag, central duct excision   CHOLECYSTECTOMY     EYE SURGERY  2023   cataract bilateral    Family History  Problem Relation Age of Onset   Breast cancer Neg Hx     Social History:  reports that she has never smoked. She has never been exposed to tobacco smoke. She has never used smokeless tobacco. She reports current alcohol use. She reports that she does not use drugs.  Allergies:  Allergies  Allergen Reactions   Penicillins Hives    Medications reviewed.    ROS Full ROS performed and is otherwise negative other than what is stated in HPI   BP 115/76   Pulse 84   Ht 5' 2 (1.575 m)   Wt 230 lb (104.3 kg)   SpO2 100%    BMI 42.07 kg/m   Physical Exam Physical Exam Vitals and nursing note reviewed. Exam conducted with a chaperone present.  Constitutional:      General: She is not in acute distress.    Appearance: Normal appearance.  Eyes:     General: No scleral icterus.       Right eye: No discharge.        Left eye: No discharge.  Cardiovascular:     Rate and Rhythm: Normal rate and regular rhythm.  Pulmonary:     Effort: Pulmonary effort is normal. No respiratory distress.     Breath sounds: Normal breath sounds. No stridor.     Comments: Breast;There are no breast masses.  There are no evidence of any nipple or skin changes.  No LAD on either axillary basins. Left  Breast scar healedl, no breast deformity  Abdominal:     General: Abdomen is flat. There is no distension.     Palpations: There is no mass.     Tenderness: There is no abdominal tenderness.     Hernia: No hernia is present.  Musculoskeletal:     Cervical back: Normal range of motion and neck supple.  Skin:    General: Skin is warm and dry.     Capillary Refill:  Capillary refill takes less than 2 seconds.     Coloration: Skin is not jaundiced.  Neurological:     General: No focal deficit present.     Mental Status: She is alert and oriented to person, place, and time.  Psychiatric:        Mood and Affect: Mood normal.        Behavior: Behavior normal.        Thought Content: Thought content normal.        Judgment: Judgment normal.   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  No evidence of complications, no clinical or radiographic evidence of suspicious lesions  Recommend yearly mamo w clinical exam  I personally spent a total of 30 minutes in the care of the patient today including performing a medically appropriate exam/evaluation, counseling and educating, placing orders, referring and communicating  with other health care professionals, documenting clinical information in the EHR, independently interpreting and reviewing images studies and coordinating care.   Laneta Luna, MD [facility] General Surgeon
